# Patient Record
Sex: Male | Born: 1995 | Race: White | Hispanic: No | Marital: Single | Smoking: Never smoker
Health system: Southern US, Community
[De-identification: ages and names within clinical notes are randomized; demographics above are authoritative.]

## PROBLEM LIST (undated history)

## (undated) DIAGNOSIS — Z202 Contact with and (suspected) exposure to infections with a predominantly sexual mode of transmission: Secondary | ICD-10-CM

## (undated) DIAGNOSIS — J029 Acute pharyngitis, unspecified: Secondary | ICD-10-CM

## (undated) DIAGNOSIS — S43432D Superior glenoid labrum lesion of left shoulder, subsequent encounter: Secondary | ICD-10-CM

## (undated) DIAGNOSIS — A545 Gonococcal pharyngitis: Secondary | ICD-10-CM

## (undated) DIAGNOSIS — R1011 Right upper quadrant pain: Secondary | ICD-10-CM

## (undated) DIAGNOSIS — R1033 Periumbilical pain: Secondary | ICD-10-CM

## (undated) DIAGNOSIS — F909 Attention-deficit hyperactivity disorder, unspecified type: Secondary | ICD-10-CM

---

## 2015-11-10 ENCOUNTER — Emergency Department (HOSPITAL_COMMUNITY)
Admission: EM | Admit: 2015-11-10 | Discharge: 2015-11-10 | Disposition: A | Discharge status: Home / Self Care | Payer: BLUE CROSS/BLUE SHIELD | Attending: Emergency Medicine | Admitting: Emergency Medicine

## 2015-11-10 ENCOUNTER — Encounter (HOSPITAL_COMMUNITY): Payer: Self-pay

## 2015-11-10 DIAGNOSIS — F10129 Alcohol abuse with intoxication, unspecified: Secondary | ICD-10-CM | POA: Diagnosis present

## 2015-11-10 DIAGNOSIS — F10120 Alcohol abuse with intoxication, uncomplicated: Secondary | ICD-10-CM | POA: Insufficient documentation

## 2015-11-10 DIAGNOSIS — F10920 Alcohol use, unspecified with intoxication, uncomplicated: Secondary | ICD-10-CM

## 2015-11-10 HISTORY — DX: Attention-deficit hyperactivity disorder, unspecified type: F90.9

## 2015-11-10 MED ORDER — ONDANSETRON 8 MG PO TBDP
8.0000 mg | ORAL_TABLET | Freq: Once | ORAL | Status: AC
  Administered 2015-11-10: 8 mg via ORAL
  Filled 2015-11-10: qty 1

## 2015-11-10 MED ORDER — ONDANSETRON 8 MG PO TBDP: ORAL_TABLET | ORAL | Status: DC

## 2015-11-10 NOTE — ED Notes (Signed)
Pt was able to drink all the fluids without any vomiting or nausea.

## 2015-11-10 NOTE — ED Notes (Signed)
Pt. from [employer] told friend to call EMS. Last seen sober 9pm. Drank 9 shots, 1 beer. cbg of 90

## 2015-11-10 NOTE — Discharge Instructions (Signed)
Alcohol Intoxication  Alcohol intoxication occurs when you drink enough alcohol that it affects your ability to function. It can be mild or very severe. Drinking a lot of alcohol in a short time is called binge drinking. This can be very harmful. Drinking alcohol can also be more dangerous if you are taking medicines or other drugs. Some of the effects caused by alcohol may include:  · Loss of coordination.  · Changes in mood and behavior.  · Unclear thinking.  · Trouble talking (slurred speech).  · Throwing up (vomiting).  · Confusion.  · Slowed breathing.  · Twitching and shaking (seizures).  · Loss of consciousness.  HOME CARE  · Do not drive after drinking alcohol.  · Drink enough water and fluids to keep your pee (urine) clear or pale yellow. Avoid caffeine.  · Only take medicine as told by your doctor.  GET HELP IF:  · You throw up (vomit) many times.  · You do not feel better after a few days.  · You frequently have alcohol intoxication. Your doctor can help decide if you should see a substance use treatment counselor.  GET HELP RIGHT AWAY IF:  · You become shaky when you stop drinking.  · You have twitching and shaking.  · You throw up blood. It may look bright red or like coffee grounds.  · You notice blood in your poop (bowel movements).  · You become lightheaded or pass out (faint).  MAKE SURE YOU:   · Understand these instructions.  · Will watch your condition.  · Will get help right away if you are not doing well or get worse.     This information is not intended to replace advice given to you by your health care provider. Make sure you discuss any questions you have with your health care provider.     Document Released: 03/10/2008 Document Revised: 05/25/2013 Document Reviewed: 02/25/2013  Elsevier Interactive Patient Education ©2016 Elsevier Inc.

## 2015-11-10 NOTE — ED Notes (Signed)
Bed: WA18 Expected date:  Expected time:  Means of arrival:  Comments: EMS 

## 2015-11-10 NOTE — ED Provider Notes (Signed)
CSN: [id number]     Arrival date & time 11/10/15  0045 History  By signing my name below, I, Kelly Morris, attest that this documentation has been prepared under the direction and in the presence of Kelly Heitman, MD. Electronically Signed: Octavia Morris, ED Scribe. 11/10/2015. 12:53 AM.    No chief complaint on file.     Patient is a 20 y.o. male presenting with intoxication. The history is provided by the patient. No language interpreter was used.  Alcohol Intoxication This is a new problem. The current episode started 1 to 2 hours ago. The problem occurs constantly. The problem has not changed since onset.Pertinent negatives include no chest pain, no abdominal pain, no headaches and no shortness of breath. Nothing aggravates the symptoms. Nothing relieves the symptoms. He has tried nothing for the symptoms. The treatment provided no relief.   HPI Comments: Kelly Morris is a 20 y.o. male who presents to the Emergency Department complaining of constant, unchanging alcohol intoxication with one episode of vomiting onset tonight. EMS reports he drank 9 shots and one beet. According to [facility], pt was last seen sober at 9pm. He has not taken his ADD medication today.  No past medical history on file. No past surgical history on file. No family history on file. Social History  Substance Use Topics  . Smoking status: Not on file  . Smokeless tobacco: Not on file  . Alcohol Use: Not on file    Review of Systems  Respiratory: Negative for shortness of breath.   Cardiovascular: Negative for chest pain.  Gastrointestinal: Positive for vomiting. Negative for abdominal pain.  Neurological: Negative for headaches.  All other systems reviewed and are negative.     Allergies  Review of patient's allergies indicates not on file.  Home Medications   Prior to Admission medications   Not on File   Triage vitals: BP 131/88 mmHg  Pulse 97  Temp(Src) 97.9 F (36.6 C) (Oral)  Resp 16  SpO2  100% Physical Exam  Constitutional: He is oriented to person, place, and time. He appears well-developed and well-nourished. No distress.  HENT:  Head: Normocephalic and atraumatic.  Mouth/Throat: Oropharynx is clear and moist.  Moist mucus membranes, no exudates  Eyes: Conjunctivae are normal. Pupils are equal, round, and reactive to light.  Neck: Normal range of motion. Neck supple.  No Bruit's  Cardiovascular: Normal rate, regular rhythm and intact distal pulses.   Pulmonary/Chest: Effort normal and breath sounds normal. No stridor. He has no wheezes. He has no rales.  Lungs clear  Abdominal: Soft. Bowel sounds are normal. There is no tenderness. There is no rebound and no guarding.  Non hyperactive  Musculoskeletal: He exhibits no edema.  Neurological: He is alert and oriented to person, place, and time.  Skin: Skin is warm and dry.  Psychiatric: He has a normal mood and affect.  Nursing note and vitals reviewed.   ED Course  Procedures  DIAGNOSTIC STUDIES: Oxygen Saturation is 100% on RA, normal by my interpretation.  COORDINATION OF CARE:  12:53 AM Discussed treatment plan with pt at bedside and pt agreed to plan.  Labs Review Labs Reviewed - No data to display  Imaging Review No results found. I have personally reviewed and evaluated these images and lab results as part of my medical decision-making.   EKG Interpretation None      MDM   Final diagnoses:  None    Po challenged awake and alert ambulate about the department, stable for  discharge at this time  I personally performed the services described in this documentation, which was scribed in my presence. The recorded information has been reviewed and is accurate.     Cy Blamer, MD 11/10/15 [phone]

## 2015-11-10 NOTE — ED Notes (Signed)
Writer provided pt with a cup of sprite

## 2016-11-17 ENCOUNTER — Emergency Department (HOSPITAL_COMMUNITY): Payer: BLUE CROSS/BLUE SHIELD

## 2016-11-17 ENCOUNTER — Encounter (HOSPITAL_COMMUNITY): Payer: Self-pay

## 2016-11-17 DIAGNOSIS — R197 Diarrhea, unspecified: Secondary | ICD-10-CM | POA: Diagnosis not present

## 2016-11-17 DIAGNOSIS — R42 Dizziness and giddiness: Secondary | ICD-10-CM | POA: Insufficient documentation

## 2016-11-17 DIAGNOSIS — F909 Attention-deficit hyperactivity disorder, unspecified type: Secondary | ICD-10-CM | POA: Insufficient documentation

## 2016-11-17 DIAGNOSIS — R112 Nausea with vomiting, unspecified: Secondary | ICD-10-CM | POA: Diagnosis not present

## 2016-11-17 DIAGNOSIS — R0789 Other chest pain: Secondary | ICD-10-CM | POA: Insufficient documentation

## 2016-11-17 LAB — BASIC METABOLIC PANEL
Anion gap: 10 (ref 5–15)
BUN: 10 mg/dL (ref 6–20)
CHLORIDE: 103 mmol/L (ref 101–111)
CO2: 26 mmol/L (ref 22–32)
Calcium: 9.6 mg/dL (ref 8.9–10.3)
Creatinine, Ser: 0.89 mg/dL (ref 0.61–1.24)
GFR calc non Af Amer: >60 mL/min (ref >60)
Glucose, Bld: 90 mg/dL (ref 65–99)
Potassium: 3.6 mmol/L (ref 3.5–5.1)
Sodium: 139 mmol/L (ref 135–145)

## 2016-11-17 LAB — CBC
HCT: 42.2 % (ref 39.0–52.0)
HEMOGLOBIN: 14.7 g/dL (ref 13.0–17.0)
MCH: 31.4 pg (ref 26.0–34.0)
MCHC: 34.8 g/dL (ref 30.0–36.0)
MCV: 90.2 fL (ref 78.0–100.0)
Platelets: 284 10*3/uL (ref 150–400)
RBC: 4.68 MIL/uL (ref 4.22–5.81)
RDW: 12.9 % (ref 11.5–15.5)
WBC: 10.3 10*3/uL (ref 4.0–10.5)

## 2016-11-17 LAB — I-STAT TROPONIN, ED: TROPONIN I, POC: 0 ng/mL (ref 0.00–0.08)

## 2016-11-17 NOTE — ED Notes (Signed)
Patient refused ibuprofen.  Reassured patient.  Patient very anxious, shaky, and concerned.

## 2016-11-17 NOTE — ED Triage Notes (Signed)
Pt states that for the past three days he has not ate due to depression, pt now c/o of n/v, denies SI/HI/AVH, pt states that he is also having chest pressure and SOB with dizziness that has been going on for the past four weeks.

## 2016-11-17 NOTE — ED Notes (Signed)
Patient states his chest pain is worsening.  Will reassure patient, reassess, and offer ibuprofen.

## 2016-11-18 ENCOUNTER — Emergency Department (HOSPITAL_COMMUNITY)
Admission: EM | Admit: 2016-11-18 | Discharge: 2016-11-18 | Disposition: A | Discharge status: Home / Self Care | Payer: BLUE CROSS/BLUE SHIELD | Attending: Emergency Medicine | Admitting: Emergency Medicine

## 2016-11-18 DIAGNOSIS — R0789 Other chest pain: Secondary | ICD-10-CM | POA: Diagnosis not present

## 2016-11-18 DIAGNOSIS — R079 Chest pain, unspecified: Secondary | ICD-10-CM

## 2016-11-18 DIAGNOSIS — R197 Diarrhea, unspecified: Secondary | ICD-10-CM

## 2016-11-18 DIAGNOSIS — R112 Nausea with vomiting, unspecified: Secondary | ICD-10-CM

## 2016-11-18 DIAGNOSIS — R42 Dizziness and giddiness: Secondary | ICD-10-CM

## 2016-11-18 LAB — URINALYSIS, ROUTINE W REFLEX MICROSCOPIC
Bilirubin Urine: NEGATIVE
GLUCOSE, UA: NEGATIVE mg/dL
HGB URINE DIPSTICK: NEGATIVE
Ketones, ur: 5 mg/dL — AB
LEUKOCYTES UA: NEGATIVE
Nitrite: NEGATIVE
PROTEIN: NEGATIVE mg/dL
Specific Gravity, Urine: 1.025 (ref 1.005–1.030)
pH: 5 (ref 5.0–8.0)

## 2016-11-18 LAB — LIPASE, BLOOD: Lipase: 14 U/L (ref 11–51)

## 2016-11-18 LAB — HEPATIC FUNCTION PANEL
ALBUMIN: 4.8 g/dL (ref 3.5–5.0)
ALK PHOS: 49 U/L (ref 38–126)
ALT: 12 U/L — ABNORMAL LOW (ref 17–63)
AST: 16 U/L (ref 15–41)
BILIRUBIN TOTAL: 0.9 mg/dL (ref 0.3–1.2)
Bilirubin, Direct: <0.1 mg/dL — ABNORMAL LOW (ref 0.1–0.5)
TOTAL PROTEIN: 7 g/dL (ref 6.5–8.1)

## 2016-11-18 LAB — D-DIMER, QUANTITATIVE (NOT AT ARMC): D DIMER QUANT: 0.29 ug{FEU}/mL (ref 0.00–0.50)

## 2016-11-18 MED ORDER — ONDANSETRON HCL 4 MG/2ML IJ SOLN
4.0000 mg | Freq: Once | INTRAMUSCULAR | Status: AC
  Administered 2016-11-18: 4 mg via INTRAVENOUS
  Filled 2016-11-18: qty 2

## 2016-11-18 MED ORDER — KETOROLAC TROMETHAMINE 30 MG/ML IJ SOLN
30.0000 mg | Freq: Once | INTRAMUSCULAR | Status: AC
  Administered 2016-11-18: 30 mg via INTRAVENOUS
  Filled 2016-11-18: qty 1

## 2016-11-18 MED ORDER — SODIUM CHLORIDE 0.9 % IV BOLUS (SEPSIS)
1000.0000 mL | Freq: Once | INTRAVENOUS | Status: AC
  Administered 2016-11-18: 1000 mL via INTRAVENOUS

## 2016-11-18 MED ORDER — ONDANSETRON 4 MG PO TBDP: 4.0000 mg | ORAL_TABLET | Freq: Three times a day (TID) | ORAL | 0 refills | Status: AC | PRN

## 2016-11-18 NOTE — ED Provider Notes (Signed)
By signing my name below, I, Avnee Patel, attest that this documentation has been prepared under the direction and in the presence of Layla MawKristen N Nioka Thorington, DO  Electronically Signed: Clovis PuAvnee Patel, ED Scribe. 11/18/16. 1:54 AM.  TIME SEEN: 1:45 AM  CHIEF COMPLAINT:  Chief Complaint  Patient presents with  . Emesis  . Chest Pain    HPI:   Kelly Morris is a 21 y.o. male who presents to the Emergency Department With multiple different complaints. Patient is complaining of acute onset lower left abdominal pain onset 3 days. Pt also reports dizziness that he describes as the room spinning, nausea, 2 episodes of vomiting, decrease of appetite x 5 days, heat flashes, chills, resolved cough, headache, diarrhea.  He has also had constant SOB and chest pressure x 4 weeks. His chest pressure is worse with stress and sprinting. His abdominal pain is worse upon palpation. No alleviating factors noted. Pt denies dysuria, pain with urination, penile discharge, testicular pain, hx of abdominal surgeries. Pt reports he had a CT-abdomen and colonoscopy in 05/2016 which showed "minor swelling in intestines". No known drug allergies. No other complaints noted. No known sick contacts or recent travel. Denies headache, numbness, tingling or focal weakness.  ROS: See HPI Constitutional: Subjective fever  Eyes: no drainage  ENT: no runny nose   Cardiovascular:  +chest pain  Resp: +  SOB  GI:  vomiting GU: no dysuria Integumentary: no rash  Allergy: no hives  Musculoskeletal: no leg swelling  Neurological: no slurred speech ROS otherwise negative  PAST MEDICAL HISTORY/PAST SURGICAL HISTORY:  Past Medical History:  Diagnosis Date  . ADHD (attention deficit hyperactivity disorder)     MEDICATIONS:  Prior to Admission medications   Medication Sig Start Date End Date Taking? Authorizing Provider  ondansetron (ZOFRAN ODT) 8 MG disintegrating tablet 8mg  ODT q8 hours prn nausea 11/10/15   April Palumbo, MD     ALLERGIES:  No Known Allergies  SOCIAL HISTORY:  Social History  Substance Use Topics  . Smoking status: Never Smoker  . Smokeless tobacco: Never Used  . Alcohol use No    FAMILY HISTORY: No family history on file.  EXAM: BP 126/66   Pulse 88   Temp 99.2 F (37.3 C) (Oral)   Resp 18   SpO2 100%  CONSTITUTIONAL: Alert and oriented and responds appropriately to questions. Well-appearing; well-nourished, afebrile and nontoxic HEAD: Normocephalic EYES: Conjunctivae clear, PERRL, EOMI ENT: normal nose; no rhinorrhea; moist mucous membranes NECK: Supple, no meningismus, no nuchal rigidity, no LAD  CARD: RRR; S1 and S2 appreciated; no murmurs, no clicks, no rubs, no gallops RESP: Normal chest excursion without splinting or tachypnea; breath sounds clear and equal bilaterally; no wheezes, no rhonchi, no rales, no hypoxia or respiratory distress, speaking full sentences ABD/GI: Normal bowel sounds; non-distended; soft, non-tender, no rebound, no guarding, no peritoneal signs, no hepatosplenomegaly BACK:  The back appears normal and is non-tender to palpation, there is no CVA tenderness EXT: Normal ROM in all joints; non-tender to palpation; no edema; normal capillary refill; no cyanosis, no calf tenderness or swelling    SKIN: Normal color for age and race; warm; no rash NEURO: Moves all extremities equally, sensation to light touch intact diffusely, cranial nerves II through XII intact, normal speech PSYCH: The patient's mood and manner are appropriate. Grooming and personal hygiene are appropriate.  MEDICAL DECISION MAKING: Patient here with multiple different complaints. He is exam is unremarkable. He is hemodynamically stable. Discussed with patient that this could  be viral illness causing his symptoms. I do not feel he needs emergent imaging of his abdomen. Doubt colitis, bowel obstruction, diverticulitis, appendicitis given his benign exam. Will treat symptomatically with  Toradol, Zofran and IV fluids. Labs, urine pending.  ED PROGRESS: Patient's workup has been unremarkable. Normal labs including negative troponin, negative d-dimer. Normal LFTs and lipase. Urine shows no sign of infection or significant dehydration. Chest x-ray shows no acute abnormality. He has been able to eat, drink and ambulate without difficulty. I feel he is safe to be discharged home with outpatient follow-up with a primary care provider.   At this time, I do not feel there is any life-threatening condition present. I have reviewed and discussed all results (EKG, imaging, lab, urine as appropriate) and exam findings with patient/family. I have reviewed nursing notes and appropriate previous records.  I feel the patient is safe to be discharged home without further emergent workup and can continue workup as an outpatient as needed. Discussed usual and customary return precautions. Patient/family verbalize understanding and are comfortable with this plan.  Outpatient follow-up has been provided. All questions have been answered.    EKG Interpretation  Date/Time:  Monday November 17 2016 20:10:28 EST Ventricular Rate:  73 PR Interval:  128 QRS Duration: 96 QT Interval:  354 QTC Calculation: 389 R Axis:   94 Text Interpretation:  Normal sinus rhythm Rightward axis RSR' or QR pattern in V1 suggests right ventricular conduction delay Nonspecific T wave abnormality Abnormal ECG No old tracing to compare Confirmed by Lanesha Azzaro,  DO, Qualyn Oyervides ([id number]) on 11/18/2016 1:23:27 AM        I personally performed the services described in this documentation, which was scribed in my presence. The recorded information has been reviewed and is accurate.     Layla Maw Laurieann Friddle, DO 11/18/16 [phone]

## 2016-11-18 NOTE — ED Notes (Signed)
Pt ambulatory with no difficulty. TOlerating PO fluids

## 2016-11-18 NOTE — Discharge Instructions (Signed)
You may alternate between Tylenol 1000 mg every 6 hours as needed for fever and pain and ibuprofen 800 mg every 8 hours as needed for fever and pain. Please drink plenty of water and rest. Your workup today including lab work, chest x-ray, urine have been normal. I recommend follow-up with her primary care physician if symptoms continue.   To find a primary care or specialty doctor please call [phone] or [phone] to access "[place] Find a Doctor Service."  You may also go on the [facility] website at [email or web address]  There are also multiple [facility], [facility], [facility] and [facility] practices throughout the [place] that are frequently accepting new patients. You may find a clinic that is close to your home and contact them.  [facility] -  [address] [place] [id number] [phone]   [facility] -  [address] [place] [id number] [phone]   [facility] [phone]  [place] [id number] [phone]

## 2016-11-23 ENCOUNTER — Emergency Department (HOSPITAL_COMMUNITY): Payer: BLUE CROSS/BLUE SHIELD

## 2016-11-23 ENCOUNTER — Encounter (HOSPITAL_COMMUNITY): Payer: Self-pay | Admitting: Emergency Medicine

## 2016-11-23 ENCOUNTER — Emergency Department (HOSPITAL_COMMUNITY)
Admission: EM | Admit: 2016-11-23 | Discharge: 2016-11-23 | Disposition: A | Discharge status: Home / Self Care | Payer: BLUE CROSS/BLUE SHIELD | Attending: Emergency Medicine | Admitting: Emergency Medicine

## 2016-11-23 DIAGNOSIS — R1030 Lower abdominal pain, unspecified: Secondary | ICD-10-CM | POA: Diagnosis not present

## 2016-11-23 DIAGNOSIS — Z79899 Other long term (current) drug therapy: Secondary | ICD-10-CM | POA: Insufficient documentation

## 2016-11-23 DIAGNOSIS — J111 Influenza due to unidentified influenza virus with other respiratory manifestations: Secondary | ICD-10-CM | POA: Insufficient documentation

## 2016-11-23 DIAGNOSIS — Z5181 Encounter for therapeutic drug level monitoring: Secondary | ICD-10-CM | POA: Insufficient documentation

## 2016-11-23 DIAGNOSIS — R51 Headache: Secondary | ICD-10-CM | POA: Diagnosis present

## 2016-11-23 LAB — RAPID URINE DRUG SCREEN, HOSP PERFORMED
Amphetamines: NOT DETECTED
BARBITURATES: NOT DETECTED
BENZODIAZEPINES: NOT DETECTED
COCAINE: NOT DETECTED
Opiates: NOT DETECTED
Tetrahydrocannabinol: NOT DETECTED

## 2016-11-23 LAB — INFLUENZA PANEL BY PCR (TYPE A & B)
INFLBPCR: NEGATIVE
Influenza A By PCR: POSITIVE — AB

## 2016-11-23 LAB — URINALYSIS, ROUTINE W REFLEX MICROSCOPIC
BILIRUBIN URINE: NEGATIVE
Bacteria, UA: NONE SEEN
GLUCOSE, UA: NEGATIVE mg/dL
Ketones, ur: 5 mg/dL — AB
LEUKOCYTES UA: NEGATIVE
NITRITE: NEGATIVE
PH: 5 (ref 5.0–8.0)
Protein, ur: NEGATIVE mg/dL
SPECIFIC GRAVITY, URINE: 1.027 (ref 1.005–1.030)
SQUAMOUS EPITHELIAL / LPF: NONE SEEN

## 2016-11-23 LAB — CBC
HCT: 43.3 % (ref 39.0–52.0)
Hemoglobin: 15.2 g/dL (ref 13.0–17.0)
MCH: 31.7 pg (ref 26.0–34.0)
MCHC: 35.1 g/dL (ref 30.0–36.0)
MCV: 90.4 fL (ref 78.0–100.0)
PLATELETS: 265 10*3/uL (ref 150–400)
RBC: 4.79 MIL/uL (ref 4.22–5.81)
RDW: 13.1 % (ref 11.5–15.5)
WBC: 7.5 10*3/uL (ref 4.0–10.5)

## 2016-11-23 LAB — COMPREHENSIVE METABOLIC PANEL
ALT: 11 U/L — ABNORMAL LOW (ref 17–63)
AST: 15 U/L (ref 15–41)
Albumin: 4.9 g/dL (ref 3.5–5.0)
Alkaline Phosphatase: 47 U/L (ref 38–126)
Anion gap: 9 (ref 5–15)
BILIRUBIN TOTAL: 0.7 mg/dL (ref 0.3–1.2)
BUN: 9 mg/dL (ref 6–20)
CALCIUM: 9.5 mg/dL (ref 8.9–10.3)
CO2: 28 mmol/L (ref 22–32)
CREATININE: 1.04 mg/dL (ref 0.61–1.24)
Chloride: 102 mmol/L (ref 101–111)
Glucose, Bld: 87 mg/dL (ref 65–99)
Potassium: 3.7 mmol/L (ref 3.5–5.1)
Sodium: 139 mmol/L (ref 135–145)
TOTAL PROTEIN: 7.6 g/dL (ref 6.5–8.1)

## 2016-11-23 LAB — LIPASE, BLOOD: Lipase: 15 U/L (ref 11–51)

## 2016-11-23 LAB — I-STAT CG4 LACTIC ACID, ED: LACTIC ACID, VENOUS: 1.07 mmol/L (ref 0.5–1.9)

## 2016-11-23 MED ORDER — MORPHINE SULFATE (PF) 4 MG/ML IV SOLN
4.0000 mg | Freq: Once | INTRAVENOUS | Status: DC
  Filled 2016-11-23: qty 1

## 2016-11-23 MED ORDER — SODIUM CHLORIDE 0.9 % IV BOLUS (SEPSIS)
2000.0000 mL | Freq: Once | INTRAVENOUS | Status: AC
Start: 2016-11-23 — End: 2016-11-23
  Administered 2016-11-23: 2000 mL via INTRAVENOUS

## 2016-11-23 MED ORDER — IOPAMIDOL (ISOVUE-300) INJECTION 61%
INTRAVENOUS | Status: AC
  Administered 2016-11-23: 100 mL
  Filled 2016-11-23: qty 100

## 2016-11-23 MED ORDER — DICYCLOMINE HCL 10 MG PO CAPS
10.0000 mg | ORAL_CAPSULE | Freq: Once | ORAL | Status: AC
  Administered 2016-11-23: 10 mg via ORAL
  Filled 2016-11-23: qty 1

## 2016-11-23 MED ORDER — OSELTAMIVIR PHOSPHATE 75 MG PO CAPS
75.0000 mg | ORAL_CAPSULE | Freq: Once | ORAL | Status: AC
  Administered 2016-11-23: 75 mg via ORAL
  Filled 2016-11-23: qty 1

## 2016-11-23 MED ORDER — OSELTAMIVIR PHOSPHATE 75 MG PO CAPS: 75.0000 mg | ORAL_CAPSULE | Freq: Two times a day (BID) | ORAL | 0 refills | Status: AC

## 2016-11-23 MED ORDER — PROMETHAZINE HCL 25 MG PO TABS: 25.0000 mg | ORAL_TABLET | Freq: Four times a day (QID) | ORAL | 0 refills | Status: AC | PRN

## 2016-11-23 MED ORDER — SODIUM CHLORIDE 0.9 % IV BOLUS (SEPSIS)
2000.0000 mL | Freq: Once | INTRAVENOUS | Status: AC
  Administered 2016-11-23: 2000 mL via INTRAVENOUS

## 2016-11-23 MED ORDER — ONDANSETRON HCL 4 MG/2ML IJ SOLN
4.0000 mg | Freq: Once | INTRAMUSCULAR | Status: AC
  Administered 2016-11-23: 4 mg via INTRAVENOUS
  Filled 2016-11-23: qty 2

## 2016-11-23 NOTE — ED Triage Notes (Signed)
Pt. Stated, I ws at another UC last Sunday with a stomach issue and I thought I was getting better, but yesterday I started having a headache with Nausea.

## 2016-11-23 NOTE — ED Notes (Signed)
Patient transported to CT 

## 2016-11-23 NOTE — ED Provider Notes (Signed)
[facility]-EMERGENCY DEPT Provider Note   CSN: [id number] Arrival date & time: 11/23/16  1240     History   Chief Complaint Chief Complaint  Patient presents with  . Abdominal Pain  . Headache  . Nausea     HPI   Blood pressure 115/72, pulse 112, temperature 99.2 F (37.3 C), temperature source Oral, resp. rate 16, height 5\' 10"  (1.778 m), weight 61.2 kg, SpO2 98 %.  Kelly Morris is a 21 y.o. male complaining of Who is otherwise healthy complaining of right-sided abdominal pain worsening over the last 2 days with associated fever of 100.8 and emesis with headache and some rhinorrhea. No sick contacts. Patient had gastritis and was evaluated last week, he states he fully recovered from this and felt improved. He denies cough, sore throat, sick contacts, diarrhea, change in urination, testicular pain or swelling.  Past Medical History:  Diagnosis Date  . ADHD (attention deficit hyperactivity disorder)     There are no active problems to display for this patient.   History reviewed. No pertinent surgical history.     Home Medications    Prior to Admission medications   Medication Sig Start Date End Date Taking? Authorizing Provider  mirtazapine (REMERON) 7.5 MG tablet Take 7.5 mg by mouth at bedtime.    Historical Provider, MD  ondansetron (ZOFRAN ODT) 4 MG disintegrating tablet Take 1 tablet (4 mg total) by mouth every 8 (eight) hours as needed for nausea or vomiting. 11/18/16   Layla Maw Ward, DO  oseltamivir (TAMIFLU) 75 MG capsule Take 1 capsule (75 mg total) by mouth every 12 (twelve) hours. 11/23/16   Demetrica Zipp, PA-C  promethazine (PHENERGAN) 25 MG tablet Take 1 tablet (25 mg total) by mouth every 6 (six) hours as needed for nausea or vomiting. 11/23/16   Wynetta Emery, PA-C    Family History No family history on file.  Social History Social History  Substance Use Topics  . Smoking status: Never Smoker  . Smokeless tobacco: Never Used  . Alcohol use 3.6  oz/week    6 Cans of beer per week     Allergies   Patient has no known allergies.   Review of Systems Review of Systems  10 systems reviewed and found to be negative, except as noted in the HPI.  Physical Exam Updated Vital Signs BP 118/65   Pulse 95   Temp 99.2 F (37.3 C) (Oral)   Resp 16   Ht 5\' 10"  (1.778 m)   Wt 61.2 kg   SpO2 98%   BMI 19.37 kg/m   Physical Exam  Constitutional: He is oriented to person, place, and time. He appears well-developed and well-nourished. No distress.  HENT:  Head: Normocephalic and atraumatic.  Mouth/Throat: Oropharynx is clear and moist.  Eyes: Conjunctivae and EOM are normal. Pupils are equal, round, and reactive to light.  Neck: Normal range of motion.  FROM to C-spine. Pt can touch chin to chest without discomfort. No TTP of midline cervical spine.   Cardiovascular: Regular rhythm and intact distal pulses.   Tachycardic, regular  Pulmonary/Chest: Effort normal and breath sounds normal.  Abdominal: Soft. There is tenderness.  Mild tenderness to palpation in the right lower and left lower quadrant, no focal tenderness palpation over McBurney's point, Rovsing, psoas and obturator are negative.  Musculoskeletal: Normal range of motion.  Neurological: He is alert and oriented to person, place, and time.  Skin: Capillary refill takes less than 2 seconds. He is not diaphoretic.  Psychiatric:  He has a normal mood and affect.  Nursing note and vitals reviewed.    ED Treatments / Results  Labs (all labs ordered are listed, but only abnormal results are displayed) Labs Reviewed  COMPREHENSIVE METABOLIC PANEL - Abnormal; Notable for the following:       Result Value   ALT 11 (*)    All other components within normal limits  URINALYSIS, ROUTINE W REFLEX MICROSCOPIC - Abnormal; Notable for the following:    Hgb urine dipstick SMALL (*)    Ketones, ur 5 (*)    All other components within normal limits  INFLUENZA PANEL BY PCR (TYPE  A & B) - Abnormal; Notable for the following:    Influenza A By PCR POSITIVE (*)    All other components within normal limits  CULTURE, BLOOD (ROUTINE X 2)  CULTURE, BLOOD (ROUTINE X 2)  LIPASE, BLOOD  CBC  RAPID URINE DRUG SCREEN, HOSP PERFORMED  I-STAT CG4 LACTIC ACID, ED    EKG  EKG Interpretation None       Radiology Ct Abdomen Pelvis W Contrast  Result Date: 11/23/2016 CLINICAL DATA:  21 year old with bilateral low abdominal pain for 1 week. Worsening pain with nausea and dry heaves over the last day. EXAM: CT ABDOMEN AND PELVIS WITH CONTRAST TECHNIQUE: Multidetector CT imaging of the abdomen and pelvis was performed using the standard protocol following bolus administration of intravenous contrast. CONTRAST:  100mL ISOVUE-300 IOPAMIDOL (ISOVUE-300) INJECTION 61% COMPARISON:  None. FINDINGS: Lower chest: Clear lung bases. No significant pleural or pericardial effusion. Hepatobiliary: The liver appears normal. No evidence of gallstones, gallbladder wall thickening or biliary dilatation. Pancreas: Unremarkable. No pancreatic ductal dilatation or surrounding inflammatory changes. Spleen: Normal in size without focal abnormality. Adrenals/Urinary Tract: Both adrenal glands appear normal. Both kidneys appear normal. There is no evidence of renal mass, urinary tract calculus or hydronephrosis. The bladder appears normal for its degree of distention. Stomach/Bowel: No evidence of bowel wall thickening, distention or surrounding inflammatory change. The appendix appears normal. No enteric contrast was administered. Vascular/Lymphatic: There are no enlarged abdominal or pelvic lymph nodes. No significant vascular findings are present. Reproductive: The prostate gland and seminal vesicles appear normal. Other: No evidence of abdominal wall mass or hernia. No ascites. Musculoskeletal: No acute or significant osseous findings. IMPRESSION: Normal examination.  No explanation for the patient's symptoms.  Electronically Signed   By: Carey BullocksWilliam  Veazey M.D.   On: 11/23/2016 15:40    Procedures Procedures (including critical care time)  Medications Ordered in ED Medications  morphine 4 MG/ML injection 4 mg (4 mg Intravenous Refused 11/23/16 1445)  sodium chloride 0.9 % bolus 2,000 mL (0 mLs Intravenous Stopped 11/23/16 1620)  ondansetron (ZOFRAN) injection 4 mg (4 mg Intravenous Given 11/23/16 1442)  iopamidol (ISOVUE-300) 61 % injection (100 mLs  Contrast Given 11/23/16 1518)  sodium chloride 0.9 % bolus 2,000 mL (0 mLs Intravenous Stopped 11/23/16 1823)  dicyclomine (BENTYL) capsule 10 mg (10 mg Oral Given 11/23/16 1619)  oseltamivir (TAMIFLU) capsule 75 mg (75 mg Oral Given 11/23/16 1821)     Initial Impression / Assessment and Plan / ED Course  I have reviewed the triage vital signs and the nursing notes.  Pertinent labs & imaging results that were available during my care of the patient were reviewed by me and considered in my medical decision making (see chart for details).     Vitals:   11/23/16 1600 11/23/16 1630 11/23/16 1700 11/23/16 1823  BP: 117/70 112/56 120/65 118/65  Pulse: 99 99 96 95  Resp:      Temp:      TempSrc:      SpO2: 99% 99% 100% 98%  Weight:      Height:        Medications  morphine 4 MG/ML injection 4 mg (4 mg Intravenous Refused 11/23/16 1445)  sodium chloride 0.9 % bolus 2,000 mL (0 mLs Intravenous Stopped 11/23/16 1620)  ondansetron (ZOFRAN) injection 4 mg (4 mg Intravenous Given 11/23/16 1442)  iopamidol (ISOVUE-300) 61 % injection (100 mLs  Contrast Given 11/23/16 1518)  sodium chloride 0.9 % bolus 2,000 mL (0 mLs Intravenous Stopped 11/23/16 1823)  dicyclomine (BENTYL) capsule 10 mg (10 mg Oral Given 11/23/16 1619)  oseltamivir (TAMIFLU) capsule 75 mg (75 mg Oral Given 11/23/16 1821)    Marrion Accomando is 21 y.o. male presenting with Fever, right lower quadrant pain and emesis he is tachycardic, abdominal exam is nonfocal however he is quite diffusely tender  with normal bowel sounds. No true peritoneal signs, this is his second visit for emesis this week. Given his tachycardia and fever at home will obtain CT abdomen pelvis. He denies any GU symptoms denies testicular pain or swelling. Blood work reassuring, patient made nothing by mouth, given his rhinorrhea will also check for influenza.  Blood work reassuring with no significant abnormalities including leukocytosis, normal lactic acid. Urinalysis with 5 ketones and small hemoglobin consistent with mild dehydration. Given the severity of his pain and reported fever CAT scan ordered and normal. He remains persistently tachycardic will continue to bolus  Flu by PCR Positive for flu a. Discussed with patient and his mother via phone.Tachycardia improved, he is tolerating by mouth's, repeat abdominal exam benign.   Evaluation does not show pathology that would require ongoing emergent intervention or inpatient treatment. Pt is hemodynamically stable and mentating appropriately. Discussed findings and plan with patient/guardian, who agrees with care plan. All questions answered. Return precautions discussed and outpatient follow up given.     Final Clinical Impressions(s) / ED Diagnoses   Final diagnoses:  Influenza    New Prescriptions Discharge Medication List as of 11/23/2016  6:36 PM    START taking these medications   Details  oseltamivir (TAMIFLU) 75 MG capsule Take 1 capsule (75 mg total) by mouth every 12 (twelve) hours., Starting Sun 11/23/2016, Print    promethazine (PHENERGAN) 25 MG tablet Take 1 tablet (25 mg total) by mouth every 6 (six) hours as needed for nausea or vomiting., Starting Sun 11/23/2016, Print         [place], PA-C 11/23/16 1914    Doug Sou, MD 11/24/16 1600

## 2016-11-23 NOTE — Discharge Instructions (Signed)
Return to the emergency room for any worsening or concerning symptoms including fast breathing, heart racing, confusion, vomiting. ° °Rest, cover your mouth when you cough and wash your hands frequently.  ° °Push fluids: water or Gatorade, do not drink any soda, juice or caffeinated beverages. ° °For fever and pain control you can take Motrin (ibuprofen) as follows: 400 mg (this is normally 2 over the counter pills) °Three hours after you have had the Motrin take Tylenol (acetaminophen) as follows: You can take 650 mg (this is normally 2 over-the-counter pills) °Repeat the series by taking Motrin 3 hours later, continue to do this while you are awake. ° °Check to see that any other over-the-counter medications don't contain acetaminophen: you shouldn't have more than 3000 mg a day. ° °Do not return to work until 48 hours after your fever breaks.  ° °

## 2016-11-23 NOTE — ED Notes (Signed)
Signature pad broken, patient stable and ambulatory at discharge does not have any questions and getting picked up by friends

## 2016-11-28 LAB — CULTURE, BLOOD (ROUTINE X 2)
Culture: NO GROWTH
Culture: NO GROWTH

## 2020-03-12 LAB — HEPATITIS B SURFACE ANTIBODY
Hep B S Ab Interp: NON-IMMUNE/NOT IMMUNE
Hep B S Ab: 5.8 — ABNORMAL LOW (ref 12.0–999.0)

## 2020-03-26 LAB — CULTURE, THROAT: FINAL REPORT: NORMAL

## 2020-04-11 LAB — C. DIFFICILE TOXIN A/B
C Diff Toxin Interpretation: NEGATIVE
C. DIFFICILE TOXIN: NEGATIVE
C.diff Toxin/Antigen: NEGATIVE
CDiffGDH: NEGATIVE
Lot/Kit Number: 321059

## 2020-04-14 LAB — CULTURE, SALMONELLA/SHIGELLA/CAMPY, STOOL

## 2020-04-16 LAB — OVA AND PARASITE SCREEN

## 2020-04-18 LAB — MISCELLANEOUS REFERENCE TEST: test code: 188672

## 2020-04-19 LAB — POC COVID-19 (LIAT IN HOUSE): SARS-CoV-2: DETECTED — AB

## 2020-05-08 LAB — STREP A CULTURE, THROAT

## 2020-05-18 LAB — CULTURE, WOUND: FINAL REPORT: NORMAL

## 2020-07-16 LAB — HEPATITIS B SURFACE ANTIBODY
Hep B S Ab Interp: IMMUNE — AB
Hep B S Ab: >1000 — ABNORMAL HIGH (ref 12.0–999.0)

## 2021-05-15 ENCOUNTER — Encounter

## 2021-05-15 ENCOUNTER — Ambulatory Visit
Admit: 2021-05-15 | Discharge: 2021-05-15 | Payer: BLUE CROSS/BLUE SHIELD | Attending: Family Medicine | Primary: Family Medicine

## 2021-05-15 DIAGNOSIS — Z0000 Encounter for general adult medical examination without abnormal findings: Secondary | ICD-10-CM

## 2021-05-15 LAB — LIPASE: Lipase: 16 U/L (ref 13–60)

## 2021-05-15 LAB — CBC WITH AUTO DIFFERENTIAL
Absolute Baso #: 0 10*3/uL (ref 0.0–0.2)
Absolute Eos #: 0.3 10*3/uL (ref 0.0–0.5)
Absolute Lymph #: 2.1 10*3/uL (ref 1.0–3.2)
Absolute Mono #: 0.4 10*3/uL (ref 0.3–1.0)
Basophils %: 0.6 % (ref 0.0–2.0)
Eosinophils %: 5.7 % (ref 0.0–7.0)
Hematocrit: 44.1 % (ref 38.0–52.0)
Hemoglobin: 15.6 g/dL (ref 13.0–17.3)
Immature Grans (Abs): 0.02 10*3/uL (ref 0.00–0.06)
Immature Granulocytes: 0.4 % (ref 0.0–0.6)
Lymphocytes: 39.3 % (ref 15.0–45.0)
MCH: 31.3 pg (ref 27.0–34.5)
MCHC: 35.4 g/dL (ref 32.0–36.0)
MCV: 88.6 fL (ref 84.0–100.0)
MPV: 10.4 fL (ref 7.2–13.2)
Monocytes: 6.6 % (ref 4.0–12.0)
Neutrophils %: 47.4 % (ref 42.0–74.0)
Neutrophils Absolute: 2.6 10*3/uL (ref 1.6–7.3)
Platelets: 262 10*3/uL (ref 140–440)
RBC: 4.98 x10e6/mcL (ref 4.00–5.60)
RDW: 11.8 % (ref 11.0–16.0)
WBC: 5.4 10*3/uL (ref 3.8–10.6)

## 2021-05-15 LAB — LIPID PANEL
Chol/HDL Ratio: 2.6 (ref 0.0–4.4)
Cholesterol: 131 mg/dL (ref 100–200)
HDL: 50 mg/dL (ref >=40)
LDL Cholesterol: 69 mg/dL (ref 0.0–100.0)
LDL/HDL Ratio: 1
Triglycerides: 61 mg/dL (ref 0–149)
VLDL: 12.2 mg/dL (ref 5.0–40.0)

## 2021-05-15 LAB — COMPREHENSIVE METABOLIC PANEL
ALT: 13 U/L (ref 0–50)
AST: 14 U/L (ref 0–50)
Albumin/Globulin Ratio: 2 (ref 1.00–2.70)
Albumin: 5 g/dL (ref 3.5–5.2)
Alk Phosphatase: 51 U/L (ref 40–130)
Anion Gap: 10 mmol/L (ref 2–17)
BUN: 16 mg/dL (ref 6–20)
CO2: 25 mmol/L (ref 22–29)
Calcium: 9.6 mg/dL (ref 8.6–10.0)
Chloride: 107 mmol/L (ref 98–107)
Creatinine: 0.8 mg/dL (ref 0.7–1.3)
Est, Glom Filt Rate: 127 mL/min/1.73m?? (ref >=90)
Globulin: 2 g/dL (ref 1.9–4.4)
Glucose: 80 mg/dL (ref 70–99)
OSMOLALITY CALCULATED: 283 mOsm/kg (ref 270–287)
Potassium: 4.6 mmol/L (ref 3.5–5.3)
Sodium: 142 mmol/L (ref 135–145)
Total Bilirubin: 0.62 mg/dL (ref 0.00–1.20)
Total Protein: 7 g/dL (ref 6.4–8.3)

## 2021-05-15 NOTE — Progress Notes (Signed)
Darius Myers (DOB:  1996/04/20) is a 25 y.o. male here for evaluation of the following chief complaint(s):  Annual Exam      ASSESSMENT/PLAN:  Routine age appropriate counseling.    1. Routine general medical examination at a health care facility    No follow-ups on file.    SUBJECTIVE/OBJECTIVE:  HPI  Here for routine yearly physical, doing well without any complaints.  Coming up on his 25th birthday to get his Darius Myers evaluation.      Review of Systems  See HPI for details    Physical Exam  Physical Exam  BP 102/60    Pulse 76    Ht 5\' 10"  (1.778 m)    Wt 156 lb (70.8 kg)    SpO2 97%    BMI 22.38 kg/m??   General Appearance:  Well appearing, developed and nourished.  Appears stated age.  No acute distress.  Head:  Normocephalic, atraumatic.  Eyes:  Pupils equal, round and reactive to light.  Extraocular muscle movements intact  Ears:  External auditory canals clear, tympanic membranes intact.  Hearing grossly intact.  Nose:  Nares patent, no lesions.  Oral Cavity:  Mucosa moist, tongue in midline.  No oropharyngeal lesions.  Neck:  Supple, full range of motion.  No cervical lymphadenopathy.  No thyroid enlargement.  Heart:  Regular rate and regular rhythm.  No murmurs.  No rubs or gallops.  Lungs:  Clear to auscultation bilaterally with good air movement, no focal adventitial sounds.  Abdomen:  Non tender to palpation and non distended.  No masses or hernias. No organomegaly.  Skin:  Normal turgor.  No rashes noted.  Skin is warm and dry.  Musculoskeletal:  Full range of motion with no swelling or deformity.  Extremities:  No edema.  No clubbing or cyanosis.  Neurologic:  Motor strength normal in the upper and lower extremities.  Sensory exam intact.  Psych:  Cognitive function intact.  Judgment and insight normal.  Mood and affect appropriate.    An electronic signature was used to authenticate this note.    , MD

## 2021-05-26 ENCOUNTER — Ambulatory Visit
Admit: 2021-05-26 | Discharge: 2021-05-26 | Payer: BLUE CROSS/BLUE SHIELD | Attending: Emergency Medicine | Primary: Family Medicine

## 2021-05-26 DIAGNOSIS — J029 Acute pharyngitis, unspecified: Secondary | ICD-10-CM

## 2021-05-26 LAB — AMB POC RAPID STREP TEST: Group A Strep Antigen, POC: NEGATIVE

## 2021-05-26 MED ORDER — DOXYCYCLINE HYCLATE 100 MG PO TABS
100 MG | ORAL_TABLET | Freq: Two times a day (BID) | ORAL | 0 refills | Status: AC
Start: 2021-05-26 — End: 2021-06-05

## 2021-05-26 MED ORDER — DEXAMETHASONE SODIUM PHOSPHATE 4 MG/ML IJ SOLN
4 MG/ML | Freq: Once | INTRAMUSCULAR | Status: AC
Start: 2021-05-26 — End: 2021-05-26
  Administered 2021-05-26: 23:00:00 10 mg via ORAL

## 2021-05-26 NOTE — Progress Notes (Signed)
Chief Complaint    Darius Myers presents with   Chief Complaint   Patient presents with    Pharyngitis     Sharp pain with swallowing since Tuesday, negative covid test today at [facility].       Time Patient seen by Provider: 6:46 PM      HISTORY:    Source: patient,appears reliable    Add'l history from: Epic medical records        Symptoms: Sore throat times 5 days    Severity of symptoms: moderate  Onset : 5 days ago    Context: Prior treatments include negative covid test today at [facility].     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 Ten ROS reviewed and negative unless otherwise noted    PAST MEDICAL HISTORY:    Past Medical History: Updated reviewed and confirmed as below.    Past Medical History:   Diagnosis Date    ADHD     Environmental allergies     Severe- sees ENT       Past Surgical History: Updated reviewed and confirmed as below.    Past Surgical History:   Procedure Laterality Date    HERNIA REPAIR         Patient's Social History: Updated reviewed and confirmed as below.    Social History     Tobacco Use    Smoking status: Never    Smokeless tobacco: Never    Tobacco comments:     Patient counselled on the dangers of tobacco 05/11/2020, Cessation: Advice given   Vaping Use    Vaping Use: Never used   Substance Use Topics    Alcohol use: Never    Drug use: Never       Patient's Family History: Updated reviewed and confirmed as below.    Family History   Problem Relation Age of Onset    Other Mother         Muir-Torre Syndrome -- genetic predisposition for CRC    Hypertension Father     No Known Problems Sister     No Known Problems Brother     No Known Problems Maternal Grandmother     No Known Problems Maternal  Grandfather     No Known Problems Paternal Grandmother     No Known Problems Paternal Grandfather     No Known Problems Other        Patient's Allergies: Updated reviewed and confirmed as below.    Zofran [ondansetron]    Current Medications: Updated reviewed and confirmed as below.      Current Outpatient Medications:     doxycycline hyclate (VIBRA-TABS) 100 MG tablet, Take 1 tablet by mouth 2 times daily for 10 days, Disp: 20 tablet, Rfl: 0    Azelastine HCl 137 MCG/SPRAY SOLN, USE 1 SPRAY IN EACH NOSTRIL TWICE DAILY, Disp: , Rfl:     fluticasone (FLONASE) 50 MCG/ACT nasal spray, USE 1 SPRAY(S) IN EACH NOSTRIL TWICE DAILY, Disp: , Rfl:     levocetirizine (XYZAL) 5 MG tablet, TAKE 1 TABLET BY MOUTH ONCE DAILY IN THE EVENING FOR 30 DAYS,  Disp: , Rfl:     EPINEPHRINE, ANAPHYLAXIS, IJ, Inject as directed, Disp: , Rfl:     Nursing Notes and Vital Signs reviewed.    PHYSICAL EXAM:    BP 120/82    Pulse 95    Temp 98.7 ??F (37.1 ??C)    Resp 16    Ht 5\' 10"  (1.778 m)    Wt 161 lb 11.2 oz (73.3 kg)    SpO2 97%    BMI 23.20 kg/m??     GENERAL: alert, well appearing, well nourished, no acute distress    SKIN: normal , no rashes    HEAD: Normocephalic, no masses, lesions, or other abnormalities.    EYE EXAM: normal conjunctiva, discharge-none    EARS: External ears normal,    NOSE: no deformity. Sinus tenderness: none. Nasal mucosa turbinates without swelling.    OROPHARYNX: l  edema, erythema, and uvula midline     NECK: adenopathy: + adenopathy, supple, normal ROM, trachea midline, neck veins flat.    CHEST: normal chest symmetry and expansion, normal AP diameter. Lungs Clear to auscultation    HEART: Regular rate    LUNGS: Normal respiratory rate and normal respiratory effort.    EXTREMITIES no cyanosis or edema. No deformities.    NEURO EXAM: Normal sensorium,normal speech, moves all extremities equally, normal gait, no ataxia.    PSYCH: alert, with normal affect, appropriate speech.    UC COURSE:    Based on the history  and physical exam the following differential diagnosis was formulated: Pharnygitis rule out strep , mono, viral pharyngitis, URI, covid     The following diagnostic studies were performed and reviewed: strep screen Negative  Results for orders placed or performed in visit on 05/26/21   POC Strep A AG, EIA (Rapid Strep) 05/28/21)   Result Value Ref Range    Valid Internal Control, POC      Group A Strep Antigen, POC Negative Negative       Treatment in the Urgent Care department included: see orders     DIAGNOSIS AND PLAN:     Diagnosis Orders   1. Sore throat  POC Strep A AG, EIA (Rapid Strep) (93790)    dexamethasone (DECADRON) injection 10 mg    doxycycline hyclate (VIBRA-TABS) 100 MG tablet            acetaminophen, gargles, liquids, NSAID's, and throat sprays/lozenges    Plan: Self care per discharge instructions.         DISPOSITION:    Discharge to home with treatment as above. Patient to return or see primary care physician if symptoms worsen or fail to improve.    ([id number], DO

## 2021-08-10 ENCOUNTER — Ambulatory Visit
Admit: 2021-08-10 | Discharge: 2021-08-10 | Payer: BLUE CROSS/BLUE SHIELD | Attending: Emergency Medicine | Primary: Family Medicine

## 2021-08-10 DIAGNOSIS — H6503 Acute serous otitis media, bilateral: Secondary | ICD-10-CM

## 2021-08-10 MED ORDER — AMOXICILLIN-POT CLAVULANATE 875-125 MG PO TABS
875-125 MG | ORAL_TABLET | Freq: Two times a day (BID) | ORAL | 0 refills | Status: AC
Start: 2021-08-10 — End: 2021-08-20

## 2021-08-10 MED ORDER — PREDNISONE 20 MG PO TABS
20 MG | ORAL_TABLET | Freq: Every day | ORAL | 0 refills | Status: AC
Start: 2021-08-10 — End: 2021-08-15

## 2021-08-10 NOTE — Progress Notes (Signed)
Darius Myers    Time Patient seen by Provider: 1:03 PM      HISTORY:    Source: patient,appears reliable    Add'l history from: Epic medical records    Chief Complaint    Darius Myers presents today for   Chief Complaint   Patient presents with    Otalgia     Ear Pressure, facial pain x 6 days    .    Symptom onset has been worsening for a time period of 6 day(s). Severity is described as moderate to severe. Course of his symptoms over time is worsening.        Location: Sinuses    Quality:Pressure      Context: Prior treatments include otc allergy meds, flonase     Sick contacts: no    Associated Symptoms:    Admits to: ear pain    Denies: fever, chills    ROS:    See HPI for pertinent positives and negatives, and a total of ten systems were reviewed and were negative unless otherwise noted.    PAST MEDICAL HISTORY:    Patient's Past Medical History :Updated, reviewed and confirmed as below.    Past Medical History:   Diagnosis Date    ADHD     Environmental allergies     Severe- sees ENT       Past Surgical History: Updated, reviewed and confirmed as below.    Past Surgical History:   Procedure Laterality Date    HERNIA REPAIR         Patient's Social History: Updated, reviewed and confirmed as below.    Social History     Tobacco Use    Smoking status: Never    Smokeless tobacco: Never    Tobacco comments:     Patient counselled on the dangers of tobacco 05/11/2020, Cessation: Advice given   Vaping Use    Vaping Use: Never used   Substance Use Topics    Alcohol use: Never    Drug use: Never       Patient's Family History: reviewed and confirmed as below.    Family History   Problem Relation Age of Onset    Other Mother         Muir-Torre Syndrome -- genetic predisposition for CRC    Hypertension Father     No Known Problems Sister     No Known Problems Brother     No Known Problems Maternal Grandmother     No Known Problems Maternal Grandfather     No Known Problems Paternal Grandmother     No Known Problems Paternal  Grandfather     No Known Problems Other        Patient's Allergies: Updated, reviewed and confirmed as below.    Ondansetron    Current Medications:Updated, reviewed and confirmed as below.      Current Outpatient Medications:     amoxicillin-clavulanate (AUGMENTIN) 875-125 MG per tablet, Take 1 tablet by mouth 2 times daily for 10 days, Disp: 20 tablet, Rfl: 0    predniSONE (DELTASONE) 20 MG tablet, Take 3 tablets by mouth daily for 5 days, Disp: 15 tablet, Rfl: 0    Azelastine HCl 137 MCG/SPRAY SOLN, USE 1 SPRAY IN EACH NOSTRIL TWICE DAILY, Disp: , Rfl:     fluticasone (FLONASE) 50 MCG/ACT nasal spray, USE 1 SPRAY(S) IN EACH NOSTRIL TWICE DAILY, Disp: , Rfl:     levocetirizine (XYZAL) 5 MG tablet, TAKE 1 TABLET BY MOUTH ONCE  DAILY IN THE EVENING FOR 30 DAYS, Disp: , Rfl:     EPINEPHRINE, ANAPHYLAXIS, IJ, Inject as directed, Disp: , Rfl:     Nursing Notes and Vital Signs reviewed.    PHYSICAL EXAM:    BP 134/74 (Site: Left Upper Arm, Position: Sitting)    Pulse 85    Temp 98.1 ??F (36.7 ??C)    Resp 18    Ht 5\' 10"  (1.778 m)    Wt 158 lb 12.8 oz (72 kg)    SpO2 98%    BMI 22.79 kg/m??     GENERAL: alert, well appearing, well nourished, no acute distress    SKIN: normal , no rashes    HEAD: Normocephalic, no masses, lesions, or other abnormalities.    EYE EXAM: normal conjunctiva, discharge-none    EARS: External ears normal, canals clear, hearing grossly intact, TM's erythema and middle ear fluid     NOSE: no deformity. Nasal mucosa turbinates with swelling. Nasal discharge:clear    OROPHARYNX: lips and tongue normal. Throat without exudate and without erythema.    NECK: adenopathy: no significant adenopathy, supple, normal ROM, trachea midline, neck veins flat.    CHEST: normal chest symmetry and expansion, normal AP diameter.    HEART: Regular rate    LUNGS: Normal respiratory rate and normal respiratory effort. Clear to auscultation    EXTREMITIES no cyanosis or edema. No deformities.    PSYCH: alert, with normal  affect, appropriate speech.    UC COURSE:    Based on the history and physical exam the following differential diagnosis was formulated:sinusitis    The following diagnostic studies were performed and reviewed:none    Treatment in the Urgent Care department included: none     Diagnosis Orders   1. Bilateral acute serous otitis media, recurrence not specified  amoxicillin-clavulanate (AUGMENTIN) 875-125 MG per tablet    predniSONE (DELTASONE) 20 MG tablet              DISPOSITION:    Discharged-Self care per discharge sheet.    Patient instructed to contact Primary Care Provider if no improvement in one week or symptoms worsen. Contact Urgent Care if unable to reach Primary Care Provider.    , DO

## 2021-10-13 ENCOUNTER — Ambulatory Visit
Admit: 2021-10-13 | Discharge: 2021-10-13 | Payer: BLUE CROSS/BLUE SHIELD | Attending: Medical | Primary: Family Medicine

## 2021-10-13 DIAGNOSIS — R110 Nausea: Secondary | ICD-10-CM

## 2021-10-13 LAB — POC COVID-19 & INFLUENZA COMBO (LIAT IN HOUSE)
INFLUENZA A: NOT DETECTED
INFLUENZA B: NOT DETECTED
SARS-CoV-2: NOT DETECTED

## 2021-10-13 MED ORDER — PROMETHAZINE HCL 25 MG PO TABS
25 MG | ORAL_TABLET | Freq: Three times a day (TID) | ORAL | 0 refills | Status: AC | PRN
Start: 2021-10-13 — End: 2021-10-15

## 2021-10-13 NOTE — Progress Notes (Signed)
Darius Myers (DOB:  [birth year]) is a 26 y.o. male, here for evaluation of the following chief complaint(s):  Headache and Nausea    Review of Systems   All other systems reviewed and are negative.    History of Present Illness:   26yo male presents with acute onset HA, nausea. Symptoms started this morning. He denies fever/chills, cough, body aches. 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 He states that his partner has had vague viral symptoms the past several days as well. Pt denies abd pain, but he states that he has had more burping, more frequent stools without diarrhea.     Physical Exam  Vitals and nursing note reviewed.   Constitutional:       Appearance: Normal appearance.   HENT:      Head: Normocephalic and atraumatic.   Eyes:      Extraocular Movements: Extraocular movements intact.   Cardiovascular:      Rate and Rhythm: Normal rate and regular rhythm.   Pulmonary:      Effort: Pulmonary effort is normal.      Breath sounds: Normal breath sounds.   Abdominal:      General: Bowel sounds are normal. There is no distension.      Palpations: Abdomen is soft.      Tenderness: There is no abdominal tenderness.   Musculoskeletal:         General: Normal range of motion.      Cervical back: Normal range of motion.   Skin:     General: Skin is warm and dry.   Neurological:      General: No focal deficit present.      Mental Status: He is alert and oriented to person, place, and time.   Psychiatric:         Mood and Affect: Mood normal.         Behavior: Behavior normal.       ASSESSMENT/PLAN:  Pt with HA, nausea - covid/flu swab ordered. Test negative, pt informed. He is advised of likely viral gastroenteritis - phenergan sent to his pharmacy. Unable to give IM phenergan in clinic as he is driving. He is advised to adhere to clear liquids, avoid dairy. If tolerating oral fluids can advance to bland diet - BRAT diet (bananas, rice, applesauce, toast).  Follow-up with PCP or return here for persistent/worsening symptoms, ER precautions advised.    No follow-ups on file.     Allergies   Allergen Reactions    Ondansetron Other (See Comments)     Tongue numbness  Tongue numbness     Vitals:    10/13/21 1529   BP: 122/70   Pulse: 81   Resp: 18   Temp: 98 ??F (36.7 ??C)   SpO2: 98%      No results found for this visit on 10/13/21.  No results found for any visits on 10/13/21.      No problem-specific Assessment & Plan notes found for this encounter.    Electronically signed by:  --Louanna Raw, PA

## 2021-11-06 ENCOUNTER — Ambulatory Visit
Admit: 2021-11-07 | Discharge: 2021-11-07 | Payer: BLUE CROSS/BLUE SHIELD | Attending: Physician Assistant | Primary: Family Medicine

## 2021-11-06 NOTE — Progress Notes (Signed)
Dequavious Harshberger (DOB:  20-Nov-1995) is a 26 y.o. male, here for evaluation of the following chief complaint(s):  Sinus Problem, Congestion (Patient did 3 covid test they all were neg), Cough, and Shortness of Breath    Past Surgical History:   Procedure Laterality Date    HERNIA REPAIR        Current Outpatient Medications   Medication Sig Dispense Refill    amoxicillin (AMOXIL) 875 MG tablet Take 1 tablet by mouth 2 times daily for 10 days 20 tablet 0    cetirizine (ZYRTEC) 10 MG tablet Take 1 tablet by mouth daily for 14 days 14 tablet 0    benzonatate (TESSALON) 100 MG capsule Take 1 capsule by mouth in the morning and 1 capsule in the evening. Do all this for 7 days. 14 capsule 0    Azelastine HCl 137 MCG/SPRAY SOLN USE 1 SPRAY IN EACH NOSTRIL TWICE DAILY      fluticasone (FLONASE) 50 MCG/ACT nasal spray USE 1 SPRAY(S) IN EACH NOSTRIL TWICE DAILY      EPINEPHRINE, ANAPHYLAXIS, IJ Inject as directed      levocetirizine (XYZAL) 5 MG tablet TAKE 1 TABLET BY MOUTH ONCE DAILY IN THE EVENING FOR 30 DAYS (Patient not taking: Reported on 10/13/2021)       No current facility-administered medications for this visit.      Vitals:    11/06/21 1932   BP: 113/79   Site: Right Upper Arm   Position: Sitting   Cuff Size: Small Adult   Pulse: 100   Resp: 18   Temp: 97.8 ??F (36.6 ??C)   TempSrc: Oral   SpO2: 96%   Weight: 165 lb 12.8 oz (75.2 kg)   Height: 5\' 10"  (1.778 m)      Allergies   Allergen Reactions    Ondansetron Other (See Comments)     Tongue numbness  Tongue numbness     History of Present Illness:   26 year old male presents today with facial pressure and runny nose with congestion and ear pressure bilaterally. Patient denies fever or chills or nausea or vomiting or chest pain or shortness of breath or labored breathing/difficulty speaking or breathing or swallowing or tolerating p.o. intake.  Physical Exam  Vitals and nursing note reviewed.   Constitutional:       General: He is not in acute distress.     Appearance: Normal  appearance. He is not ill-appearing, toxic-appearing or diaphoretic.   HENT:      Head: Normocephalic and atraumatic.      Right Ear: Ear canal and external ear normal. No laceration or drainage. Tympanic membrane is erythematous. Tympanic membrane is not perforated.      Nose: Nose normal.      Mouth/Throat:      Mouth: Mucous membranes are moist.      Pharynx: Oropharynx is clear.   Eyes:      General: No scleral icterus.        Right eye: No discharge.         Left eye: No discharge.      Extraocular Movements: Extraocular movements intact.      Pupils: Pupils are equal, round, and reactive to light.   Cardiovascular:      Rate and Rhythm: Normal rate and regular rhythm.   Pulmonary:      Effort: Pulmonary effort is normal. No respiratory distress.      Breath sounds: Normal breath sounds. No wheezing.   Skin:  General: Skin is warm and dry.   Neurological:      General: No focal deficit present.      Mental Status: He is alert.     ASSESSMENT/PLAN:  No results found for any visits on 11/06/21.    Supportive measures discussed in detail with patient and medication risk versus benefit discussed with patient. I have extensively informed the patient of reasons to return to [facility] [place] and have informed them to return if they experience symptoms including but not limited to worsening of their symptoms. Patient expresses understanding and appreciation with today's management plan and expresses appreciation with the [facility] [place] staff after time was taken to discuss likely etiologies contributing to their symptoms and shared decision making occurred.  Although outpatient disposition decision was addressed at today's visit, I have informed patient not to ignore their symptoms, but to present to an emergency Department facility if symptoms worsen in any way.      Right acute otitis media  No follow-ups on file.  No results found for this  visit on 11/06/21.  Visit Diagnoses         Codes    Right acute otitis media    -  Primary H66.91            Diagnosis Orders   1. Right acute otitis media          No problem-specific Assessment & Plan notes found for this encounter.         Electronically signed by:  --Amanda Cockayne, PA-C

## 2021-11-07 MED ORDER — CETIRIZINE HCL 10 MG PO TABS
10 MG | ORAL_TABLET | Freq: Every day | ORAL | 0 refills | Status: AC
Start: 2021-11-07 — End: 2021-11-20

## 2021-11-07 MED ORDER — AMOXICILLIN 875 MG PO TABS
875 MG | ORAL_TABLET | Freq: Two times a day (BID) | ORAL | 0 refills | Status: AC
Start: 2021-11-07 — End: 2021-11-16

## 2021-11-07 MED ORDER — BENZONATATE 100 MG PO CAPS
100 MG | ORAL_CAPSULE | Freq: Two times a day (BID) | ORAL | 0 refills | Status: AC
Start: 2021-11-07 — End: 2021-11-13

## 2021-12-22 ENCOUNTER — Ambulatory Visit: Admit: 2021-12-22 | Discharge: 2021-12-22 | Payer: BLUE CROSS/BLUE SHIELD | Primary: Family Medicine

## 2021-12-22 ENCOUNTER — Ambulatory Visit
Admit: 2021-12-22 | Discharge: 2021-12-22 | Payer: PRIVATE HEALTH INSURANCE | Attending: Family Medicine | Primary: Family Medicine

## 2021-12-22 DIAGNOSIS — M25531 Pain in right wrist: Secondary | ICD-10-CM

## 2021-12-22 MED ORDER — IBUPROFEN 600 MG PO TABS
600 MG | ORAL_TABLET | Freq: Three times a day (TID) | ORAL | 0 refills | Status: DC | PRN
Start: 2021-12-22 — End: 2022-11-25

## 2021-12-22 NOTE — Progress Notes (Signed)
Darius Myers (DOB:  [birth year]) is a 26 y.o. 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 Reports he reached out and grabbed the steering wheel with his right hand to stop his fall.  He has noticed pain to the right elbow and wrist, especially on supination all day today. There is minimal swelling. ROM limited in supination by pain. Reported some numbness to the right hand today after he noticed a "pop" when supinating wrist.  This is a work injury     BP 125/85 (Site: Right Upper Arm, Position: Sitting, Cuff Size: Medium Adult)    Pulse 74    Temp 98.3 ??F (36.8 ??C) (Oral)    Resp 16    SpO2 96%      Allergies   Allergen Reactions    Ondansetron Other (See Comments)     Tongue numbness  Tongue numbness      Current Outpatient Medications   Medication Sig Dispense Refill    ibuprofen (ADVIL;MOTRIN) 600 MG tablet Take 1 tablet by mouth 3 times daily as needed for Pain 30 tablet 0    Azelastine HCl 137 MCG/SPRAY SOLN USE 1 SPRAY IN EACH NOSTRIL TWICE DAILY      fluticasone (FLONASE) 50 MCG/ACT nasal spray USE 1 SPRAY(S) IN EACH NOSTRIL TWICE DAILY      EPINEPHRINE, ANAPHYLAXIS, IJ Inject as directed       No current facility-administered medications for this visit.      Past Medical History:   Diagnosis Date    ADHD     Environmental allergies     Severe- sees ENT      Past Surgical History:   Procedure Laterality Date    HERNIA REPAIR        Family History   Problem Relation Age of Onset    Other Mother         Muir-Torre Syndrome -- genetic predisposition for CRC    Hypertension Father     No Known Problems Sister     No Known Problems Brother     No Known Problems Maternal Grandmother     No Known Problems Maternal Grandfather     No  Known Problems Paternal Grandmother     No Known Problems Paternal Grandfather     No Known Problems Other       Social History     Occupational History    Not on file   Tobacco Use    Smoking status: Never    Smokeless tobacco: Never    Tobacco comments:     Patient counselled on the dangers of tobacco 05/11/2020, Cessation: Advice given   Vaping Use    Vaping Use: Never used   Substance and Sexual Activity    Alcohol use: Never    Drug use: Never    Sexual activity: Yes     Partners: Male          Review of Systems   Constitutional:  Negative for activity change and fever.   HENT:  Negative for rhinorrhea.    Eyes: Negative.    Respiratory:  Negative for cough.    Cardiovascular: Negative.    Gastrointestinal: Negative.    Endocrine: Negative.  Genitourinary: Negative.    Musculoskeletal:  Positive for arthralgias and myalgias.   Skin: Negative.    Allergic/Immunologic: Negative.    Neurological:  Negative for weakness.   Hematological: Negative.    Psychiatric/Behavioral: Negative.          Objective   Physical Exam  Constitutional:       Appearance: Normal appearance.   HENT:      Head: Normocephalic and atraumatic.      Right Ear: External ear normal.      Left Ear: External ear normal.   Eyes:      Extraocular Movements: Extraocular movements intact.      Pupils: Pupils are equal, round, and reactive to light.   Cardiovascular:      Rate and Rhythm: Normal rate and regular rhythm.   Pulmonary:      Effort: Pulmonary effort is normal.      Breath sounds: Normal breath sounds. No wheezing or rhonchi.   Musculoskeletal:      Right elbow: Tenderness present.      Right wrist: Tenderness present. No lacerations, snuff box tenderness or crepitus. Decreased range of motion. Normal pulse.        Arms:       Cervical back: Normal range of motion.      Comments: TTP over the distal wrist ulnar aspect as well as the elbow, especially at the ulna. There is no swelling. ROM in pronation of wrist is limited by pain. 5/5  strength.   Neurological:      General: No focal deficit present.      Mental Status: He is alert.   Psychiatric:         Mood and Affect: Mood normal.         Behavior: Behavior normal.             ASSESSMENT/PLAN:  1. Right wrist pain  -     XR WRIST RIGHT (MIN 3 VIEWS); Future      X-ray shows no fracture or dislocation.    -     ibuprofen (ADVIL;MOTRIN) 600 MG tablet; Take 1 tablet by mouth 3 times daily as needed for Pain, Disp-30 tablet, R-0Normal    We will prescribe Ibuprofen and recommend icing in 30 minute intervals. Patient will return to full duty after one shift for rest. He should return to clinic if symptoms worsen. He expresses understanding ans agreement with plan of care.     2. Right elbow pain  -     XR ELBOW RIGHT (MIN 3 VIEWS); Future    X-ray shows no fracture or dislocation.    -     ibuprofen (ADVIL;MOTRIN) 600 MG tablet; Take 1 tablet by mouth 3 times daily as needed for Pain, Disp-30 tablet, R-0Normal    We will prescribe Ibuprofen and recommend icing in 30 minute intervals. Patient will return to full duty after one shift for rest. He should return to clinic if symptoms worsen. He expresses understanding ans agreement with plan of care.       Return if symptoms worsen or fail to improve.           An electronic signature was used to authenticate this note.    --Kenton Kingfisher, MD

## 2022-02-13 ENCOUNTER — Ambulatory Visit
Admit: 2022-02-13 | Discharge: 2022-02-13 | Payer: BLUE CROSS/BLUE SHIELD | Attending: Family Medicine | Primary: Family Medicine

## 2022-02-13 ENCOUNTER — Inpatient Hospital Stay: Admit: 2022-02-13 | Discharge: 2022-02-13 | Payer: BLUE CROSS/BLUE SHIELD | Primary: Family Medicine

## 2022-02-13 DIAGNOSIS — Z113 Encounter for screening for infections with a predominantly sexual mode of transmission: Secondary | ICD-10-CM

## 2022-02-13 DIAGNOSIS — J029 Acute pharyngitis, unspecified: Secondary | ICD-10-CM

## 2022-02-13 LAB — AMB POC RAPID STREP TEST: Group A Strep Antigen, POC: NEGATIVE

## 2022-02-13 LAB — POC HETEROPHILE ANTIBODY SCREEN: Mononucleosis Screen, POC: NEGATIVE

## 2022-02-14 ENCOUNTER — Encounter

## 2022-02-14 LAB — HIV SCREEN: HIV Screen: NEGATIVE

## 2022-02-17 LAB — MISCELLANEOUS SENDOUT: test code: 188698

## 2022-02-18 ENCOUNTER — Telehealth

## 2022-02-18 NOTE — Telephone Encounter (Signed)
Attempted to call pt to let him know we need him to come back in to repeat a test. Unable to LM due to phone never went to VM. Portal message sent

## 2022-02-19 ENCOUNTER — Encounter

## 2022-02-19 LAB — RPR: RPR: NONREACTIVE

## 2022-02-19 NOTE — Telephone Encounter (Signed)
Pt contacted and pt came by and was re swabbed.

## 2022-02-25 LAB — CT/NG BY NAA, PHARYNGEAL
Chlamydia Trachomatis, NAA, Nasopharynx: NEGATIVE
Neisseria Gonorrhoeae, NAA, Nasopharynx: NEGATIVE

## 2022-03-10 NOTE — Progress Notes (Signed)
Darius Myers (DOB:  05/06/1996) is a 26 y.o. male here for evaluation of the following chief complaint(s):  Other (Requesting Referral to GI ) and Blood Work (Possible blood work - Pt had oral intercourse and has developed spots and soreness in his throat  )    Assessment and Plan   1. Pharyngitis, unspecified etiology  Comments:  Reviewed differential, could be simply viral unrelated, but we'll screen other possibilities, aware of return precautions if does not resolve.  Orders:  -     HIV Screen; Future  -     RPR; Future  -     POC Heterophile Antibodies (63845)  -     POC Strep A AG, EIA (Rapid Strep) (36468)  2. Screening examination for sexually transmitted disease  -     HIV Screen; Future  -     RPR; Future  3. Lynch syndrome  Comments:  Diagnostic testing we'll ensure has GI and Heme/Onc to follow with long term ensuring appropriate screening intervals.  Orders:  -     [facility] - Harrold Donath, MD, Hematology & Oncology - [place]  -     Gay Filler, DO - Gastroenterology  4. MSH2 gene mutation  -     [facility] - Harrold Donath, MD, Hematology & Oncology - [place]  -     Gay Filler, DO - Gastroenterology  5. Monoallelic mutation of SMARCA4 gene  -     [facility] - Harrold Donath, MD, Hematology & Oncology - [place], DO - Gastroenterology    Return if symptoms worsen or fail to improve, for Pending specialist evaluation., Pending labs and/or testing.  Subjective   History  Patient reports he had oral intercourse with another male partner and since has noted a sore throat, and spots he thinks in his posterior oropharynx, curious what it might be.  No systemic fevers or chills, would be open to full STD testing.   Objective   Physical Exam  Constitutional:       General: He is not in acute distress.     Appearance: Normal appearance.   HENT:      Head: Normocephalic and atraumatic.      Mouth/Throat:      Pharynx: Posterior oropharyngeal erythema (palantine tonsils with scant  exudate) present.   Eyes:      Extraocular Movements: Extraocular movements intact.      Pupils: Pupils are equal, round, and reactive to light.   Cardiovascular:      Rate and Rhythm: Normal rate and regular rhythm.      Heart sounds: No murmur heard.    No friction rub. No gallop.   Pulmonary:      Effort: Pulmonary effort is normal.      Breath sounds: Normal breath sounds. No wheezing, rhonchi or rales.   Musculoskeletal:         General: No deformity. Normal range of motion.   Skin:     General: Skin is warm and dry.   Neurological:      General: No focal deficit present.      Mental Status: He is alert and oriented to person, place, and time.   Psychiatric:         Mood and Affect: Mood normal.         Behavior: Behavior normal.         Thought Content: Thought content normal.         Judgment: Judgment  normal.     Results for orders placed or performed in visit on 02/13/22   Miscellaneous Sendout   Result Value Ref Range    Test Name CGT throat     Reference Lab [facility]     test code [phone]     Test Results Seperate Report    Results for orders placed or performed during the hospital encounter of 02/13/22   HIV Screen   Result Value Ref Range    HIV Screen Negative Negative   RPR   Result Value Ref Range    RPR Non-Reactive Non-Reactive   Results for orders placed or performed in visit on 02/13/22   POC Heterophile Antibodies (20355)   Result Value Ref Range    VALID INTERNAL CONTROL      Mononucleosis Screen, POC Negative    POC Strep A AG, EIA (Rapid Strep) (97416)   Result Value Ref Range    Valid Internal Control, POC      Group A Strep Antigen, POC Negative Negative     An electronic signature was used to authenticate this note.  Adair Patter, MD

## 2022-04-10 ENCOUNTER — Ambulatory Visit: Admit: 2022-04-10 | Discharge: 2022-04-10 | Payer: BLUE CROSS/BLUE SHIELD | Attending: Family | Primary: Family Medicine

## 2022-04-10 DIAGNOSIS — J020 Streptococcal pharyngitis: Secondary | ICD-10-CM

## 2022-04-10 LAB — POC COVID-19 & INFLUENZA COMBO (LIAT IN HOUSE)
INFLUENZA A: NOT DETECTED
INFLUENZA B: NOT DETECTED
SARS-CoV-2: NOT DETECTED

## 2022-04-10 LAB — AMB POC RAPID STREP A: Group A Strep Antigen, POC: POSITIVE

## 2022-04-10 MED ORDER — PROMETHAZINE HCL 25 MG PO TABS
25 MG | ORAL_TABLET | Freq: Four times a day (QID) | ORAL | 0 refills | Status: AC | PRN
Start: 2022-04-10 — End: 2022-04-17

## 2022-04-10 MED ORDER — AMOXICILLIN-POT CLAVULANATE 875-125 MG PO TABS
875-125 MG | ORAL_TABLET | Freq: Two times a day (BID) | ORAL | 0 refills | Status: AC
Start: 2022-04-10 — End: 2022-04-20

## 2022-04-10 MED ORDER — PREDNISONE 20 MG PO TABS
20 MG | ORAL_TABLET | Freq: Every day | ORAL | 0 refills | Status: AC
Start: 2022-04-10 — End: 2022-04-15

## 2022-04-10 NOTE — Progress Notes (Signed)
Darius Myers (DOB:  [birth year]) is a 26 y.o. male,Established patient, here for evaluation of the following chief complaint(s):  Fever and Abdominal Pain (Since yesterday also having nausea and post nasal drip.)      History of Present Illness:     26 year old male presents with complaints of fever abdominal pain nausea postnasal drip starting yesterday.  Fever as high as 102.  Associated tachycardia.  Nausea without vomiting. 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  Associated left lower quadrant abdominal pain.  No diarrhea, no blood in stools.  Past Medical History:   Diagnosis Date    ADHD     Environmental allergies     Severe- sees ENT      Past Surgical History:   Procedure Laterality Date    HERNIA REPAIR        Current Outpatient Medications   Medication Sig Dispense Refill    Acetaminophen (TYLENOL 8 HOUR PO) Take by mouth      promethazine (PHENERGAN) 25 MG tablet Take 1 tablet by mouth every 6 hours as needed for Nausea 30 tablet 0    amoxicillin-clavulanate (AUGMENTIN) 875-125 MG per tablet Take 1 tablet by mouth 2 times daily for 10 days 20 tablet 0    predniSONE (DELTASONE) 20 MG tablet Take 2 tablets by mouth daily for 5 days 10 tablet 0    Azelastine HCl 137 MCG/SPRAY SOLN USE 1 SPRAY IN EACH NOSTRIL TWICE DAILY      fluticasone (FLONASE) 50 MCG/ACT nasal spray USE 1 SPRAY(S) IN EACH NOSTRIL TWICE DAILY      EPINEPHRINE, ANAPHYLAXIS, IJ Inject as directed      ibuprofen (ADVIL;MOTRIN) 600 MG tablet Take 1 tablet by mouth 3 times daily as needed for Pain (Patient not taking: Reported on 04/10/2022) 30 tablet 0     No current facility-administered medications for this visit.      Allergies   Allergen Reactions    Ondansetron Other (See Comments)     Tongue numbness  Tongue numbness       Vitals:    04/10/22 0902   BP: 112/72   Pulse: (!) 112   Resp: 18   Temp: 97.3 F (36.3 C)   TempSrc: Tympanic   SpO2: 97%   Weight: 162 lb (73.5 kg)   Height: 5\' 10"  (1.778 m)         Physical Exam:  Physical  Exam  Vitals and nursing note reviewed.   Constitutional:       Appearance: Normal appearance.   HENT:      Head: Normocephalic and atraumatic.      Right Ear: Tympanic membrane and ear canal normal.      Left Ear: Tympanic membrane and ear canal normal.      Nose: Congestion present.      Mouth/Throat:      Pharynx: Pharyngeal swelling, posterior oropharyngeal erythema and uvula swelling present.      Tonsils: 2+ on the right. 2+ on the left.   Cardiovascular:      Rate and Rhythm: Normal rate and regular rhythm.   Pulmonary:      Effort: Pulmonary effort is normal.      Breath sounds: Normal breath sounds.   Abdominal:      Tenderness: There is abdominal tenderness.   Skin:     General: Skin is warm and dry.   Neurological:      Mental Status: He is alert and oriented to person, place, and time. Mental  status is at baseline.            ASSESSMENT/PLAN:    ICD-10-CM    1. Strep pharyngitis  J02.0 amoxicillin-clavulanate (AUGMENTIN) 875-125 MG per tablet     predniSONE (DELTASONE) 20 MG tablet      2. Fever, unspecified fever cause  R50.9 POC COVID-19 & Influenza Combo ([facility])     POC COVID-19 & Influenza Combo ([facility])      3. Sore throat  J02.9 POC Strep A Assay w/Optic (10272)      4. Nausea  R11.0 promethazine (PHENERGAN) 25 MG tablet      5. LLQ abdominal pain  R10.32            1. Strep pharyngitis  -     amoxicillin-clavulanate (AUGMENTIN) 875-125 MG per tablet; Take 1 tablet by mouth 2 times daily for 10 days, Disp-20 tablet, R-0Normal  -     predniSONE (DELTASONE) 20 MG tablet; Take 2 tablets by mouth daily for 5 days, Disp-10 tablet, R-0Normal  2. Fever, unspecified fever cause  -     POC COVID-19 & Influenza Combo ([facility] in [facility]); Future  3. Sore throat  -     POC Strep A Assay w/Optic (53664)  4. Nausea  -     promethazine (PHENERGAN) 25 MG tablet; Take 1 tablet by mouth every 6 hours as needed for Nausea, Disp-30 tablet, R-0Normal  5. LLQ abdominal pain      Visit Diagnoses         Codes     Strep pharyngitis    -  Primary J02.0    Fever, unspecified fever cause     R50.9    Sore throat     J02.9    LLQ abdominal pain     R10.32             Results for orders placed or performed in visit on 04/10/22   POC COVID-19 & Influenza Combo ([facility] in [facility])   Result Value Ref Range    SARS-CoV-2 Not Detected Not Detected    INFLUENZA A Not Detected Not Detected    INFLUENZA B Not Detected Not Detected    Narrative    Is this test for diagnosis or screening?->Diagnosis of ill patient  Symptomatic for COVID-19 as defined by CDC?->Yes  Date of Symptom Onset->04/09/22  Hospitalized for COVID-19?->No  Admitted to ICU for COVID-19?->No  Employed in healthcare setting?->No  Resident in a congregate (group) care setting?->No  Previously tested for COVID-19?->No  Pregnant:->No   POC Strep A Assay w/Optic (40347)   Result Value Ref Range    Valid Internal Control, POC yes     Group A Strep Antigen, POC Positive Negative              Testing negative for COVID influenza, positive for strep throat.  Patient with allergy to Zofran.  We will treat strep pharyngitis with Augmentin prednisone, Phenergan for nausea.  Patient advised to return if symptoms worsen or remain unresolved.  Patient advised to continue monitoring left lower quadrant abdominal pain if it becomes more severe ER precautions as advised.      Return if symptoms worsen or fail to improve.      Electronically signed by:  --Fuller Song, APRN - NP

## 2022-06-03 ENCOUNTER — Ambulatory Visit
Admit: 2022-06-03 | Discharge: 2022-06-03 | Payer: BLUE CROSS/BLUE SHIELD | Attending: Physician Assistant | Primary: Family Medicine

## 2022-06-03 DIAGNOSIS — J069 Acute upper respiratory infection, unspecified: Secondary | ICD-10-CM

## 2022-06-03 MED ORDER — METHYLPREDNISOLONE 4 MG PO TBPK
4 MG | PACK | ORAL | 0 refills | Status: DC
Start: 2022-06-03 — End: 2022-11-25

## 2022-06-03 MED ORDER — AMOXICILLIN-POT CLAVULANATE 875-125 MG PO TABS
875-125 MG | ORAL_TABLET | Freq: Two times a day (BID) | ORAL | 0 refills | Status: AC
Start: 2022-06-03 — End: 2022-06-10

## 2022-06-03 NOTE — Progress Notes (Unsigned)
ASSESSMENT/PLAN:     {There are no diagnoses linked to this encounter. (Refresh or delete this SmartLink)}    No results found for any visits on 06/03/22.    There are no Patient Instructions on file for this visit.     No follow-ups on file.          CHIEF COMPLAINT:  Darius Myers (DOB:  12/10/1995) is a 26 y.o. male, here for evaluation of the following chief complaint(s):  Sinus Problem (Patient has been having sinus issues for 7 days , pressure in ears & teeth )      HPI:  HPI    CURRENT MEDICATIONS:  Outpatient Medications Marked as Taking for the 06/03/22 encounter (Office Visit) with Jason Nest, PA   Medication Sig Dispense Refill   . fluticasone (FLONASE) 50 MCG/ACT nasal spray USE 1 SPRAY(S) IN EACH NOSTRIL TWICE DAILY     . EPINEPHRINE, ANAPHYLAXIS, IJ Inject as directed         REVIEW OF SYSTEMS:  Review of Systems    ALLERGIES:  Allergies   Allergen Reactions   . Ondansetron Other (See Comments)     Tongue numbness  Tongue numbness        PAST MEDICAL HISTORY:  Past Medical History:   Diagnosis Date   . ADHD    . Environmental allergies     Severe- sees ENT       PAST SURGICAL HISTORY:  Past Surgical History:   Procedure Laterality Date   . HERNIA REPAIR         FAMILY HISTORY:  Family History   Problem Relation Age of Onset   . Other Mother         Muir-Torre Syndrome -- genetic predisposition for CRC   . Hypertension Father    . No Known Problems Sister    . No Known Problems Brother    . No Known Problems Maternal Grandmother    . No Known Problems Maternal Grandfather    . No Known Problems Paternal Grandmother    . No Known Problems Paternal Grandfather    . No Known Problems Other        TOBACCO HISTORY:  Social History     Tobacco Use   Smoking Status Never   Smokeless Tobacco Never   Tobacco Comments    Patient counselled on the dangers of tobacco 05/11/2020, Cessation: Advice given       SOCIAL HISTORY:  Social History     Socioeconomic History   . Marital status: Single     Spouse name: None    . Number of children: None   . Years of education: None   . Highest education level: None   Tobacco Use   . Smoking status: Never   . Smokeless tobacco: Never   . Tobacco comments:     Patient counselled on the dangers of tobacco 05/11/2020, Cessation: Advice given   Vaping Use   . Vaping Use: Never used   Substance and Sexual Activity   . Alcohol use: Never   . Drug use: Never   . Sexual activity: Yes     Partners: Male       OB HISTORY:  OB History   No obstetric history on file.       VITAL SIGNS:  Vitals:    06/03/22 1904   BP: 120/76   Pulse: 86   Resp: 18   Temp: 98.6 F (37 C)   SpO2: 99%  Weight: 164 lb (74.4 kg)   Height: 5\' 10"  (1.778 m)       PHYSICAL EXAM:  Physical Exam           An electronic signature was used to authenticate this note.    --Jason Nest, PA

## 2022-11-25 ENCOUNTER — Ambulatory Visit
Admit: 2022-11-25 | Discharge: 2022-11-25 | Payer: BLUE CROSS/BLUE SHIELD | Attending: Family Medicine | Primary: Family Medicine

## 2022-11-25 ENCOUNTER — Ambulatory Visit: Payer: BLUE CROSS/BLUE SHIELD | Attending: Family Medicine | Primary: Family Medicine

## 2022-11-25 DIAGNOSIS — Z0000 Encounter for general adult medical examination without abnormal findings: Secondary | ICD-10-CM

## 2022-11-25 NOTE — Progress Notes (Signed)
Darius Myers (DOB:  1996/07/13) is a 27 y.o. male here for evaluation of the following chief complaint(s):  Annual Exam    Assessment and Plan   1. Routine general medical examination at a health care facility  Comments:  Routine age appropriate counseling.    Return in about 1 year (around 11/26/2023) for Physical.  Subjective   History  Here for yearly physical, doing well.  Getting married - doing a [employer] cruise in November.  Objective   Physical Exam  Constitutional:       General: He is not in acute distress.     Appearance: Normal appearance.   HENT:      Head: Normocephalic and atraumatic.      Right Ear: Tympanic membrane, ear canal and external ear normal.      Left Ear: Tympanic membrane, ear canal and external ear normal.      Nose: No congestion or rhinorrhea.      Mouth/Throat:      Mouth: Mucous membranes are moist.      Pharynx: Oropharynx is clear. No oropharyngeal exudate or posterior oropharyngeal erythema.   Eyes:      Extraocular Movements: Extraocular movements intact.      Conjunctiva/sclera: Conjunctivae normal.      Pupils: Pupils are equal, round, and reactive to light.   Cardiovascular:      Rate and Rhythm: Normal rate and regular rhythm.      Pulses: Normal pulses.      Heart sounds: Normal heart sounds. No murmur heard.     No friction rub. No gallop.   Pulmonary:      Effort: Pulmonary effort is normal.      Breath sounds: Normal breath sounds. No wheezing, rhonchi or rales.   Abdominal:      General: Bowel sounds are normal. There is no distension.      Palpations: Abdomen is soft. There is no mass.      Tenderness: There is no abdominal tenderness.      Hernia: No hernia is present.   Musculoskeletal:         General: No swelling, tenderness or deformity. Normal range of motion.      Cervical back: Normal range of motion and neck supple. No tenderness.   Lymphadenopathy:      Cervical: No cervical adenopathy.   Skin:     General: Skin is warm and dry.      Findings: No lesion or rash.    Neurological:      General: No focal deficit present.      Mental Status: He is alert and oriented to person, place, and time. Mental status is at baseline.   Psychiatric:         Mood and Affect: Mood normal.         Behavior: Behavior normal.         Thought Content: Thought content normal.         Judgment: Judgment normal.       No results found for any visits on 11/25/22.  An electronic signature was used to authenticate this note.  Adair Patter, MD

## 2022-11-25 NOTE — Progress Notes (Signed)
Darius Myers (DOB:  [birth year]) is a 27 y.o. male here for evaluation of the following chief complaint(s):  Annual Exam    Assessment and Plan   1. Routine general medical examination at a health care facility  Comments:  Routine age appropriate counseling.    No follow-ups on file.  Subjective   History  Here for yearly review, doing well overall.  Starting to exercise, and packing meals.  Objective   Physical Exam  Constitutional:       General: He is not in acute distress.     Appearance: Normal appearance.   HENT:      Head: Normocephalic and atraumatic.      Right Ear: Tympanic membrane, ear canal and external ear normal.      Left Ear: Tympanic membrane, ear canal and external ear normal.      Nose: No congestion or rhinorrhea.      Mouth/Throat:      Mouth: Mucous membranes are moist.      Pharynx: Oropharynx is clear. No oropharyngeal exudate or posterior oropharyngeal erythema.   Eyes:      Extraocular Movements: Extraocular movements intact.      Conjunctiva/sclera: Conjunctivae normal.      Pupils: Pupils are equal, round, and reactive to light.   Cardiovascular:      Rate and Rhythm: Normal rate and regular rhythm.      Pulses: Normal pulses.      Heart sounds: Normal heart sounds. No murmur heard.     No friction rub. No gallop.   Pulmonary:      Effort: Pulmonary effort is normal.      Breath sounds: Normal breath sounds. No wheezing, rhonchi or rales.   Abdominal:      General: Bowel sounds are normal. There is no distension.      Palpations: Abdomen is soft. There is no mass.      Tenderness: There is no abdominal tenderness.      Hernia: No hernia is present.   Musculoskeletal:         General: No swelling, tenderness or deformity. Normal range of motion.      Cervical back: Normal range of motion and neck supple. No tenderness.   Lymphadenopathy:      Cervical: No cervical adenopathy.   Skin:     General: Skin is warm and dry.      Findings: No lesion or rash.   Neurological:      General: No focal  deficit present.      Mental Status: He is alert and oriented to person, place, and time. Mental status is at baseline.   Psychiatric:         Mood and Affect: Mood normal.         Behavior: Behavior normal.         Thought Content: Thought content normal.         Judgment: Judgment normal.       No results found for any visits on 11/25/22.  An electronic signature was used to authenticate this note.  Adair Patter, MD

## 2023-01-18 ENCOUNTER — Ambulatory Visit
Admit: 2023-01-18 | Discharge: 2023-01-18 | Payer: BLUE CROSS/BLUE SHIELD | Attending: Physician Assistant | Primary: Family Medicine

## 2023-01-18 DIAGNOSIS — J019 Acute sinusitis, unspecified: Secondary | ICD-10-CM

## 2023-01-18 MED ORDER — SACCHAROMYCES BOULARDII 250 MG PO CAPS
250 | ORAL_CAPSULE | Freq: Two times a day (BID) | ORAL | 0 refills | Status: AC
Start: 2023-01-18 — End: 2023-02-17

## 2023-01-18 MED ORDER — AMOXICILLIN-POT CLAVULANATE 875-125 MG PO TABS
875-125 | ORAL_TABLET | Freq: Two times a day (BID) | ORAL | 0 refills | Status: AC
Start: 2023-01-18 — End: 2023-01-25

## 2023-01-18 MED ORDER — PREDNISONE 5 MG PO TABS
5 | ORAL_TABLET | ORAL | 0 refills | Status: AC
Start: 2023-01-18 — End: 2023-01-23

## 2023-01-18 NOTE — Progress Notes (Signed)
ASSESSMENT/PLAN:     1. Acute sinusitis, recurrence not specified, unspecified location  -     amoxicillin-clavulanate (AUGMENTIN) 875-125 MG per tablet; Take 1 tablet by mouth 2 times daily for 7 days, Disp-14 tablet, R-0Normal  2. Lower respiratory infection (e.g., bronchitis, pneumonia, pneumonitis, pulmonitis)  -     amoxicillin-clavulanate (AUGMENTIN) 875-125 MG per tablet; Take 1 tablet by mouth 2 times daily for 7 days, Disp-14 tablet, R-0Normal  -     predniSONE (DELTASONE) 5 MG tablet; Take 6 tablets by mouth daily for 1 day, THEN 5 tablets daily for 1 day, THEN 4 tablets daily for 1 day, THEN 3 tablets daily for 1 day, THEN 2 tablets daily for 1 day, THEN 1 tablet daily for 1 day., Disp-21 tablet, R-0Normal  3. Antibiotics causing adverse effect in therapeutic use, initial encounter  -     saccharomyces boulardii (FLORASTOR) 250 MG capsule; Take 1 capsule by mouth 2 times daily, Disp-60 capsule, R-0Normal    Treating with a steroid taper and antibiotics.  Patient also requesting probiotics prescription due to antibiotic use.  Medications reviewed.  Continue supportive care including rest, hydration, over-the-counter medications.  Discussed signs and symptoms to monitor.  Follow-up if no improvement/worsening.        CHIEF COMPLAINT:  Darius Myers (DOB:  11-06-1995) is a 27 y.o. male, here for evaluation of the following chief complaint(s):  Sinus Problem (Sick for 10 days he has green stuff coming out of nose, facial pain took tylenol and sudafed x5 days and dayquil)      HPI:  Darius Myers is a 26 year old male presenting today with a cough, congestion, sinus pain/pressure that started 10 days ago.  This morning when he woke up, he was having some shortness of breath.  He has also had worsening sinus pain and pressure along with purulent nasal discharge.  No fevers or chills.  No wheezing.  Has been taking over-the-counter medications.          CURRENT MEDICATIONS:  Outpatient Medications Marked as Taking  for the 01/18/23 encounter (Office Visit) with Jason Nest, PA   Medication Sig Dispense Refill    amoxicillin-clavulanate (AUGMENTIN) 875-125 MG per tablet Take 1 tablet by mouth 2 times daily for 7 days 14 tablet 0    predniSONE (DELTASONE) 5 MG tablet Take 6 tablets by mouth daily for 1 day, THEN 5 tablets daily for 1 day, THEN 4 tablets daily for 1 day, THEN 3 tablets daily for 1 day, THEN 2 tablets daily for 1 day, THEN 1 tablet daily for 1 day. 21 tablet 0    saccharomyces boulardii (FLORASTOR) 250 MG capsule Take 1 capsule by mouth 2 times daily 60 capsule 0    Azelastine HCl 137 MCG/SPRAY SOLN       fluticasone (FLONASE) 50 MCG/ACT nasal spray USE 1 SPRAY(S) IN EACH NOSTRIL TWICE DAILY      EPINEPHRINE, ANAPHYLAXIS, IJ Inject as directed         REVIEW OF SYSTEMS:  Review of Systems   Constitutional:  Negative for chills and fever.   HENT:  Positive for congestion, sinus pressure and sinus pain. Negative for sore throat.    Respiratory:  Positive for cough and shortness of breath.    Cardiovascular:  Negative for chest pain.   Neurological:  Negative for dizziness and headaches.       ALLERGIES:  Allergies   Allergen Reactions    Ondansetron Other (See Comments)     Tongue  numbness  Tongue numbness        PAST MEDICAL HISTORY:  Past Medical History:   Diagnosis Date    ADHD     Environmental allergies     Severe- sees ENT       PAST SURGICAL HISTORY:  Past Surgical History:   Procedure Laterality Date    HERNIA REPAIR         FAMILY HISTORY:  Family History   Problem Relation Age of Onset    Other Mother         Muir-Torre Syndrome -- genetic predisposition for CRC    Hypertension Father     No Known Problems Sister     No Known Problems Brother     No Known Problems Maternal Grandmother     No Known Problems Maternal Grandfather     No Known Problems Paternal Grandmother     No Known Problems Paternal Grandfather     No Known Problems Other        TOBACCO HISTORY:  Social History     Tobacco Use    Smoking Status Never   Smokeless Tobacco Never   Tobacco Comments    Patient counselled on the dangers of tobacco 05/11/2020, Cessation: Advice given       SOCIAL HISTORY:  Social History     Socioeconomic History    Marital status: Single     Spouse name: None    Number of children: None    Years of education: None    Highest education level: None   Tobacco Use    Smoking status: Never    Smokeless tobacco: Never    Tobacco comments:     Patient counselled on the dangers of tobacco 05/11/2020, Cessation: Advice given   Vaping Use    Vaping Use: Never used   Substance and Sexual Activity    Alcohol use: Yes     Comment: once ever 6-41mo    Drug use: Never    Sexual activity: Yes     Partners: Male       OB HISTORY:  OB History   No obstetric history on file.       VITAL SIGNS:  Vitals:    01/18/23 1111   BP: 109/78   Pulse: (!) 102   Resp: 18   Temp: 97.9 F (36.6 C)   SpO2: 98%   Weight: 77.7 kg (171 lb 6.4 oz)   Height: 1.778 m ( )       PHYSICAL EXAM:  Physical Exam  Vitals reviewed.   Constitutional:       General: He is not in acute distress.  HENT:      Head: Normocephalic and atraumatic.      Comments: B/l sinus tenderness  Eyes:      Extraocular Movements: Extraocular movements intact.      Conjunctiva/sclera: Conjunctivae normal.   Cardiovascular:      Rate and Rhythm: Normal rate and regular rhythm.      Heart sounds: No murmur heard.     No friction rub. No gallop.   Pulmonary:      Effort: Pulmonary effort is normal. No respiratory distress.      Breath sounds: Normal breath sounds. No wheezing or rhonchi.   Skin:     General: Skin is warm and dry.   Neurological:      General: No focal deficit present.      Mental Status: He is alert and oriented to person, place, and  time.   Psychiatric:         Mood and Affect: Mood normal.         Behavior: Behavior normal.                An electronic signature was used to authenticate this note.    --Zenaida Deed, PA

## 2023-02-11 ENCOUNTER — Encounter
Admit: 2023-02-11 | Discharge: 2023-02-11 | Payer: BLUE CROSS/BLUE SHIELD | Attending: Family Medicine | Primary: Family Medicine

## 2023-02-11 DIAGNOSIS — J4520 Mild intermittent asthma, uncomplicated: Secondary | ICD-10-CM

## 2023-02-11 MED ORDER — ALBUTEROL SULFATE HFA 108 (90 BASE) MCG/ACT IN AERS
108 (90 Base) MCG/ACT | Freq: Four times a day (QID) | RESPIRATORY_TRACT | 11 refills | Status: DC | PRN
Start: 2023-02-11 — End: 2023-07-09

## 2023-03-04 NOTE — Progress Notes (Signed)
Darius Myers (DOB:  1996/03/06) is a 27 y.o. male, here for evaluation of the following chief complaint(s):  Shortness of Breath (Intermittently. Pt stated this happened after he was sick recently) and Migraine (Pt noted migraines in the past 3-4wk. )    Vitals:    02/11/23 1341   BP: 108/62   Pulse: 98   SpO2: 99%   Weight: 80.3 kg (177 lb)   Height: 1.778 m (5\' 10" )      Assessment & Plan   1. Mild intermittent asthma without complication  -     albuterol sulfate HFA (PROVENTIL HFA) 108 (90 Base) MCG/ACT inhaler; Inhale 2 puffs into the lungs every 6 hours as needed for Wheezing or Shortness of Breath, Disp-18 g, R-11Normal  2. Seasonal allergies    Assessment & Plan  1. Sinusitis.  The patient's symptoms suggest a possible exacerbation of his sinus allergies. The patient is advised to use an over-the-counter antihistamine to alleviate sinus drainage.    2. Mild intermittent asthma.  The patient's airways sound clear today. The patient is prescribed an albuterol inhaler to be used as needed for wheezing, chest tightness, or shortness of breath. Should the patient's symptoms persist, a pulmonary function test will be considered.     3.  Headaches  Suspect related to caffeine withdrawal.  He agrees.  Will monitor intake and notify if persistent or develops red flag symptoms.  No follow-ups on file.     Subjective   Shortness of Breath    Migraine       History of Present Illness  The patient presents for evaluation of multiple medical concerns.    Approximately 3 to 4 weeks ago, the patient sought medical attention at an urgent care facility where he was diagnosed with a sinus infection. However, the possibility of pneumonia, bronchitis, or both could not be ruled out. A 5-day course of Z-Pak was initiated, and the patient's condition improved. The patient reports intermittent episodes of shortness of breath, even at rest, which he attributes to the duration of his recovery from bronchitis or pneumonia. He initially  suspected COVID-19, but his COVID-19 tests returned negative results. He recalls being on an inhaler for a brief period during his childhood.    Over the past 3 to 4 weeks, the patient has been experiencing severe headaches upon waking in the morning, both at work and around 2:30 PM. These headaches are a new symptom for him. He also experiences nausea, which he attributes to inadequate food intake, but the nausea persists. Over-the-counter Tylenol has proven ineffective, but the patient prefers to avoid daily use of Tylenol or Motrin due to concerns about daily safety. The patient spends more time outdoors at work. He has reduced his caffeine intake from 14 to 15 drinks per day to 1 to 2 drinks per day. He has been gradually reducing his caffeine intake over the past 2 years.    The patient is currently using Flonase nasal spray and received two allergy injections yesterday, which have provided significant relief. He has discontinued over-the-counter medications. He has not previously tried Singulair. His antibiotic regimen has caused gastrointestinal upset. His fianc and neighbor fell ill around the time of his illness. His throat is sore, but it is not as severe as the first time.    Supplemental Information  He denies any burning or frequency with urination. He has 1 or 2 liquid bowel movements, which he attributes to his diet. He avoids greasy foods and eats red  meat once or twice a week as it causes his stomach to get upset.         Objective   Physical Exam  Constitutional:       General: He is not in acute distress.     Appearance: Normal appearance.   HENT:      Head: Normocephalic and atraumatic.      Nose: Congestion and rhinorrhea present.      Mouth/Throat:      Mouth: Mucous membranes are moist.      Pharynx: Oropharynx is clear. No oropharyngeal exudate or posterior oropharyngeal erythema.   Eyes:      Extraocular Movements: Extraocular movements intact.      Pupils: Pupils are equal, round, and  reactive to light.   Cardiovascular:      Rate and Rhythm: Normal rate and regular rhythm.      Heart sounds: No murmur heard.     No friction rub. No gallop.   Pulmonary:      Effort: Pulmonary effort is normal.      Breath sounds: Normal breath sounds. No wheezing, rhonchi or rales.   Musculoskeletal:         General: No deformity. Normal range of motion.      Cervical back: Normal range of motion and neck supple.   Skin:     General: Skin is warm and dry.   Neurological:      General: No focal deficit present.      Mental Status: He is alert and oriented to person, place, and time.   Psychiatric:         Mood and Affect: Mood normal.         Behavior: Behavior normal.         Thought Content: Thought content normal.         Judgment: Judgment normal.        Physical Exam          Results       Office Visit on 04/10/2022   Component Date Value Ref Range Status    SARS-CoV-2 04/10/2022 Not Detected  Not Detected Final    Influenza A 04/10/2022 Not Detected  Not Detected Final    Influenza B 04/10/2022 Not Detected  Not Detected Final    Valid Internal Control, POC 04/10/2022 yes   Final    Group A Strep Antigen, POC 04/10/2022 Positive  Negative Final   Telephone on 02/18/2022   Component Date Value Ref Range Status    Chlamydia Trachomatis, NAA, Nasoph* 02/19/2022 Negative  Negative Final    Neisseria Gonorrhoeae, NAA, Nasoph* 02/19/2022 Negative  Negative Final   Orders Only on 02/13/2022   Component Date Value Ref Range Status    Test Name 02/13/2022 CGT throat   Final    Reference Lab 02/13/2022 [facility]   Final    test code 02/13/2022 [id number]   Final    Test Results 02/13/2022 Seperate Report   Final   Hospital Outpatient Visit on 02/13/2022   Component Date Value Ref Range Status    HIV Screen 02/13/2022 Negative  Negative Final    RPR 02/13/2022 Non-Reactive  Non-Reactive Final   Office Visit on 02/13/2022   Component Date Value Ref Range Status    Mononucleosis Screen, POC 02/13/2022 Negative   Final    Group  A Strep Antigen, POC 02/13/2022 Negative  Negative Final   Office Visit on 10/13/2021   Component Date Value Ref Range Status  SARS-CoV-2 10/13/2021 Not Detected  Not Detected Final    Influenza A 10/13/2021 Not Detected  Not Detected Final    Influenza B 10/13/2021 Not Detected  Not Detected Final   Office Visit on 05/26/2021   Component Date Value Ref Range Status    Group A Strep Antigen, POC 05/26/2021 Negative  Negative Final   Orders Only on 05/15/2021   Component Date Value Ref Range Status    WBC 05/15/2021 5.4  3.8 - 10.6 x10e3/mcL Final    RBC 05/15/2021 4.98  4.00 - 5.60 x10e6/mcL Final    Hemoglobin 05/15/2021 15.6  13.0 - 17.3 g/dL Final    Hematocrit 16/07/9603 44.1  38.0 - 52.0 % Final    MCV 05/15/2021 88.6  84.0 - 100.0 fL Final    MCH 05/15/2021 31.3  27.0 - 34.5 pg Final    MCHC 05/15/2021 35.4  32.0 - 36.0 g/dL Final    RDW 54/06/8118 11.8  11.0 - 16.0 % Final    Platelets 05/15/2021 262  140 - 440 x10e3/mcL Final    MPV 05/15/2021 10.4  7.2 - 13.2 fL Final    Neutrophils % 05/15/2021 47.4  42.0 - 74.0 % Final    Lymphocytes 05/15/2021 39.3  15.0 - 45.0 % Final    Monocytes % 05/15/2021 6.6  4.0 - 12.0 % Final    Eosinophils % 05/15/2021 5.7  0.0 - 7.0 % Final    Basophils % 05/15/2021 0.6  0.0 - 2.0 % Final    Neutrophils Absolute 05/15/2021 2.6  1.6 - 7.3 x10e3/mcL Final    Lymphocytes Absolute 05/15/2021 2.1  1.0 - 3.2 x10e3/mcL Final    Monocytes Absolute 05/15/2021 0.4  0.3 - 1.0 x10e3/mcL Final    Eosinophils Absolute 05/15/2021 0.3  0.0 - 0.5 x10e3/mcL Final    Basophils Absolute 05/15/2021 0.0  0.0 - 0.2 x10e3/mcL Final    Immature Granulocytes % 05/15/2021 0.4  0.0 - 0.6 % Final    Immature Grans (Abs) 05/15/2021 0.02  0.00 - 0.06 x10e3/mcL Final    Cholesterol 05/15/2021 131  100 - 200 mg/dL Final    HDL 14/78/2956 50  >=40 mg/dL Final    Triglycerides 05/15/2021 61  0 - 149 mg/dL Final    LDL Cholesterol 05/15/2021 69.0  0.0 - 100.0 mg/dL Final    LDL/HDL Ratio 05/15/2021 1.0   Final     Chol/HDL Ratio 05/15/2021 2.6  0.0 - 4.4 Final    VLDL 05/15/2021 12.2  5.0 - 40.0 mg/dL Final    Sodium 21/30/8657 142  135 - 145 mmol/L Final    Potassium 05/15/2021 4.6  3.5 - 5.3 mmol/L Final    Chloride 05/15/2021 107  98 - 107 mmol/L Final    CO2 05/15/2021 25  22 - 29 mmol/L Final    Glucose 05/15/2021 80  70 - 99 mg/dL Final    BUN 84/69/6295 16  6 - 20 mg/dL Final    Creatinine 28/41/3244 0.8  0.7 - 1.3 mg/dL Final    Anion Gap 10/08/7251 10  2 - 17 mmol/L Final    Osmolaliy Calculated 05/15/2021 283  270 - 287 mOsm/kg Final    Calcium 05/15/2021 9.6  8.6 - 10.0 mg/dL Final    Total Protein 05/15/2021 7.0  6.4 - 8.3 g/dL Final    Albumin 66/44/0347 5.0  3.5 - 5.2 g/dL Final    Globulin 42/59/5638 2.0  1.9 - 4.4 g/dL Final    Albumin/Globulin Ratio 05/15/2021 2.00  1.00 -  2.70 Final    Total Bilirubin 05/15/2021 0.62  0.00 - 1.20 mg/dL Final    Alk Phosphatase 05/15/2021 51  40 - 130 unit/L Final    AST 05/15/2021 14  0 - 50 unit/L Final    ALT 05/15/2021 13  0 - 50 unit/L Final    Est, Glom Filt Rate 05/15/2021 127  >=90 mL/min/1.2m Final    Lipase 05/15/2021 16  13 - 60 unit/L Final      Office Visit on 04/10/2022   Component Date Value Ref Range Status    SARS-CoV-2 04/10/2022 Not Detected  Not Detected Final    Comment: Test performed at [facility] [place],  [place] [address]  The cobas SARS-CoV-2 & Influenza A/B Nucleic acid test for use on the cobas  Liat System (cobas SARS-CoV-2 & Influenza A/B) is an automated multiplex  real-time RT-PCR assay intended for the simultaneous rapid in vitro  qualitative detection and differentiation of SARS-CoV-2, influenza A, and  influenza B virus RNA in healthcare provider-collected nasopharyngeal and  nasal swabs, from individuals suspected of respiratory viral infection  consistent with COVID-19 by their healthcare provider.  Cobas SARS-CoV-2 & Influenza A/B is intended for use in the simultaneous rapid  in vitro detection and  differentiation of SARS-CoV-2, influenza A virus, and  influenza B virus nucleic acids in clinical specimens and is not intended to  detect influenza C virus. SARS-CoV-2, influenza A and influenza B viral RNA is  generally detectable in respiratory specimens during the acute phase of  infection. Positive resul                           ts are indicative of active infection but do not rule  out bacterial infection or co-infection with other pathogens not detected by  the test. Clinical correlation with patient history and other diagnostic  information is necessary to determine patient infection status. The agent  detected may not be the definite cause of disease.  Negative results do not preclude SARS-CoV-2, influenza A and/or influenza B  infection and should not be used as the sole basis for diagnosis, treatment or  other patient management decisions. Negative results must be combined with  clinical observations, patient history, and/or epidemiological information.  The cobas SARS-CoV-2 & Influenza A/B Nucleic acid test is only for use under  the Food and Drug Administration's Emergency Use Authorization.      Influenza A 04/10/2022 Not Detected  Not Detected Final    Comment: Test performed at [facility], [place],  [place] [address]      Influenza B 04/10/2022 Not Detected  Not Detected Final    Comment: Test performed at [facility], [place],  [place] [address]      Valid Internal Control, POC 04/10/2022 yes   Final    Group A Strep Antigen, POC 04/10/2022 Positive  Negative Final      No results found for this visit on 02/11/23.   No results found for any visits on 02/11/23.  Allergies   Allergen Reactions    Ondansetron Other (See Comments)     Tongue numbness  Tongue numbness      Prior to Admission medications    Medication Sig Start Date End Date Taking? Authorizing Provider   albuterol sulfate HFA (PROVENTIL HFA) 108 (90 Base) MCG/ACT inhaler Inhale 2  puffs into the lungs every 6 hours as needed  for Wheezing or Shortness of Breath 02/11/23  Yes Quierra Silverio, Noreene Filbert, MD   Azelastine HCl 137 MCG/SPRAY SOLN  02/15/21  Yes [provider]   fluticasone (FLONASE) 50 MCG/ACT nasal spray USE 1 SPRAY(S) IN EACH NOSTRIL TWICE DAILY 02/15/21  Yes [provider]   EPINEPHRINE, ANAPHYLAXIS, IJ Inject as directed   Yes [provider]      Family History   Problem Relation Age of Onset    Other Mother         Muir-Torre Syndrome -- genetic predisposition for CRC    Hypertension Father     No Known Problems Sister     No Known Problems Brother     No Known Problems Maternal Grandmother     No Known Problems Maternal Grandfather     No Known Problems Paternal Grandmother     No Known Problems Paternal Grandfather     No Known Problems Other       Social History     Socioeconomic History    Marital status: Single     Spouse name: Not on file    Number of children: Not on file    Years of education: Not on file    Highest education level: Not on file   Occupational History    Not on file   Tobacco Use    Smoking status: Never    Smokeless tobacco: Never    Tobacco comments:     Patient counselled on the dangers of tobacco 05/11/2020, Cessation: Advice given   Vaping Use    Vaping Use: Never used   Substance and Sexual Activity    Alcohol use: Yes     Comment: once ever 6-32mo    Drug use: Never    Sexual activity: Yes     Partners: Male   Other Topics Concern    Not on file   Social History Narrative    Not on file     Social Determinants of Health     Financial Resource Strain: Not on file   Food Insecurity: Not on file   Transportation Needs: Not on file   Physical Activity: Not on file   Stress: Not on file   Social Connections: Not on file   Intimate Partner Violence: Not on file   Housing Stability: Not on file      Past Surgical History:   Procedure Laterality Date    HERNIA REPAIR        Past Medical History:   Diagnosis Date    ADHD     Environmental  allergies     Severe- sees ENT            The patient (or guardian, if applicable) and other individuals in attendance with the patient were advised that Artificial Intelligence will be utilized during this visit to record and process the conversation to generate a clinical note. The patient (or guardian, if applicable) and other individuals in attendance at the appointment consented to the use of AI, including the recording.      An electronic signature was used to authenticate this note.  --Laurence Ferrari, MD
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
## 2023-04-06 NOTE — Telephone Encounter (Signed)
Please call pt. And Let them know there are no appts. And schedule them in the first available spot.    Thanks,

## 2023-04-08 ENCOUNTER — Ambulatory Visit: Admit: 2023-04-08 | Discharge: 2023-04-08 | Payer: BLUE CROSS/BLUE SHIELD | Primary: Family Medicine

## 2023-04-08 ENCOUNTER — Ambulatory Visit
Admit: 2023-04-08 | Discharge: 2023-04-08 | Payer: BLUE CROSS/BLUE SHIELD | Attending: Surgical | Primary: Family Medicine

## 2023-04-08 ENCOUNTER — Ambulatory Visit: Admit: 2023-04-08 | Discharge: 2023-04-08 | Payer: BLUE CROSS/BLUE SHIELD | Attending: Family | Primary: Family Medicine

## 2023-04-08 VITALS — Ht 70.0 in | Wt 174.0 lb

## 2023-04-08 DIAGNOSIS — M542 Cervicalgia: Secondary | ICD-10-CM

## 2023-04-08 DIAGNOSIS — S43302A Subluxation of unspecified parts of left shoulder girdle, initial encounter: Principal | ICD-10-CM

## 2023-04-08 MED ORDER — CYCLOBENZAPRINE HCL 10 MG PO TABS
10 | ORAL_TABLET | Freq: Two times a day (BID) | ORAL | 0 refills | Status: AC | PRN
Start: 2023-04-08 — End: 2023-04-18

## 2023-04-08 MED ORDER — METHYLPREDNISOLONE 4 MG PO TBPK
4 | ORAL_TABLET | ORAL | 0 refills | Status: AC
Start: 2023-04-08 — End: 2023-04-14

## 2023-04-08 MED ORDER — DICLOFENAC SODIUM 75 MG PO TBEC
75 | ORAL_TABLET | Freq: Two times a day (BID) | ORAL | 0 refills | Status: DC
Start: 2023-04-08 — End: 2023-04-19
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
## 2023-04-19 ENCOUNTER — Ambulatory Visit
Admit: 2023-04-19 | Discharge: 2023-04-19 | Payer: BLUE CROSS/BLUE SHIELD | Attending: Physician Assistant | Primary: Family Medicine

## 2023-04-19 DIAGNOSIS — J029 Acute pharyngitis, unspecified: Secondary | ICD-10-CM

## 2023-04-19 LAB — POC COVID-19 & INFLUENZA COMBO (LIAT IN HOUSE)
Influenza A: NOT DETECTED
Influenza B: NOT DETECTED
SARS-CoV-2: DETECTED — AB

## 2023-04-19 LAB — AMB POC RAPID STREP A: Group A Strep Antigen, POC: NEGATIVE

## 2023-04-19 MED ORDER — ALBUTEROL SULFATE HFA 108 (90 BASE) MCG/ACT IN AERS
108 | Freq: Four times a day (QID) | RESPIRATORY_TRACT | 0 refills | Status: AC | PRN
Start: 2023-04-19 — End: ?

## 2023-04-19 NOTE — Patient Instructions (Signed)
Your COVID test was positive in clinic today.   Return to work or school criteria: >24 hours without fever and without use of fever reducing products and symptoms improved.   Tylenol or Motrin if needed and able to take.  Stay well hydrated.  Separation from other household members (staying in a separate room when possible and wearing a mask when in the same room)  Frequent hand washing for all family members  Frequent disinfection of commonly touched surfaces  If symptoms become severe, you develop chest pain, difficulty breathing, or any other alarming signs or symptoms, go to the ED at that time.

## 2023-04-19 NOTE — Progress Notes (Signed)
ASSESSMENT/PLAN:     1. Sore throat  -     POC COVID-19 & Influenza Combo ([facility] in House); Future  -     POC Strep A Assay w/Optic (36644)  2. COVID-19  -     albuterol sulfate HFA (VENTOLIN HFA) 108 (90 Base) MCG/ACT inhaler; Inhale 2 puffs into the lungs 4 times daily as needed for Wheezing, Disp-18 g, R-0Normal      Results for orders placed or performed in visit on 04/19/23   POC COVID-19 & Influenza Combo ([facility] in House)   Result Value Ref Range    SARS-CoV-2 Detected (A) Not Detected    Influenza A Not Detected Not Detected    Influenza B Not Detected Not Detected    Narrative    Pregnant:->No   POC Strep A Assay w/Optic (03474)   Result Value Ref Range    Valid Internal Control, POC pass     Group A Strep Antigen, POC Negative      Covid testing positive.  Sending in an inhaler to use as needed.  Recommend supportive care, including over-the-counter medications as needed.  Tylenol and/or motrin for fever/aches.  Rest, stay hydrated.  Discussed signs or symptoms to monitor.  ER precautions given.  Reviewed CDC guidelines for quarantine for COVID.  Follow-up if no improvement/worsening symptoms.             CHIEF COMPLAINT:  Darius Myers (DOB:  November 12, 1995) is a 27 y.o. male, here for evaluation of the following chief complaint(s):  Pharyngitis (SOB, Dizziness, Nausea, Joint pain x 1 day )      HPI:  Darius Myers is a 27 year old male presenting today with a cough, congestion, chills, dizziness, and shortness of breath that started yesterday.  He is also having some bodyaches and a sore throat.  No definite fevers.  No difficulty swallowing.  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      Pharyngitis  Associated symptoms: congestion, cough, headaches, myalgias, shortness of breath and sore throat    Associated symptoms: no chest pain and no fever        CURRENT MEDICATIONS:  Outpatient Medications Marked as Taking for the 04/19/23 encounter (Office Visit) with Jason Nest, PA   Medication Sig Dispense Refill     DICLOFENAC PO Take by mouth      albuterol sulfate HFA (VENTOLIN HFA) 108 (90 Base) MCG/ACT inhaler Inhale 2 puffs into the lungs 4 times daily as needed for Wheezing 18 g 0    Azelastine HCl 137 MCG/SPRAY SOLN       fluticasone (FLONASE) 50 MCG/ACT nasal spray USE 1 SPRAY(S) IN EACH NOSTRIL TWICE DAILY      EPINEPHRINE, ANAPHYLAXIS, IJ Inject as directed         REVIEW OF SYSTEMS:  Review of Systems   Constitutional:  Positive for chills. Negative for fever.   HENT:  Positive for congestion and sore throat.    Respiratory:  Positive for cough and shortness of breath.    Cardiovascular:  Negative for chest pain.   Musculoskeletal:  Positive for myalgias.   Neurological:  Positive for dizziness and headaches.       ALLERGIES:  Allergies   Allergen Reactions    Ondansetron Other (See Comments)     Tongue numbness  Tongue numbness        PAST MEDICAL HISTORY:  Past Medical History:   Diagnosis Date    ADHD     Environmental allergies  Severe- sees ENT       PAST SURGICAL HISTORY:  Past Surgical History:   Procedure Laterality Date    HERNIA REPAIR         FAMILY HISTORY:  Family History   Problem Relation Age of Onset    Other Mother         Muir-Torre Syndrome -- genetic predisposition for CRC    Hypertension Father     No Known Problems Sister     No Known Problems Brother     No Known Problems Maternal Grandmother     No Known Problems Maternal Grandfather     No Known Problems Paternal Grandmother     No Known Problems Paternal Grandfather     No Known Problems Other        TOBACCO HISTORY:  Social History     Tobacco Use   Smoking Status Never   Smokeless Tobacco Never   Tobacco Comments    Patient counselled on the dangers of tobacco 05/11/2020, Cessation: Advice given       SOCIAL HISTORY:  Social History     Socioeconomic History    Marital status: Single     Spouse name: None    Number of children: None    Years of education: None    Highest education level: None   Tobacco Use    Smoking status: Never     Smokeless tobacco: Never    Tobacco comments:     Patient counselled on the dangers of tobacco 05/11/2020, Cessation: Advice given   Vaping Use    Vaping Use: Never used   Substance and Sexual Activity    Alcohol use: Yes     Comment: once ever 6-29mo    Drug use: Never    Sexual activity: Yes     Partners: Male       OB HISTORY:  OB History   No obstetric history on file.       VITAL SIGNS:  Vitals:    04/19/23 1200   BP: 113/64   Pulse: (!) 104   Resp: 18   Temp: 97.9 F (36.6 C)   SpO2: 100%   Weight: 78.9 kg (174 lb)       PHYSICAL EXAM:  Physical Exam  Vitals reviewed.   Constitutional:       General: He is not in acute distress.     Appearance: He is not toxic-appearing.   HENT:      Head: Normocephalic and atraumatic.      Right Ear: Tympanic membrane normal.      Left Ear: Tympanic membrane normal.      Mouth/Throat:      Pharynx: Posterior oropharyngeal erythema present. No oropharyngeal exudate.   Eyes:      Extraocular Movements: Extraocular movements intact.      Conjunctiva/sclera: Conjunctivae normal.   Cardiovascular:      Rate and Rhythm: Normal rate and regular rhythm.      Heart sounds: No murmur heard.     No friction rub. No gallop.   Pulmonary:      Effort: Pulmonary effort is normal. No respiratory distress.      Breath sounds: Normal breath sounds. No wheezing or rhonchi.   Skin:     General: Skin is warm and dry.   Neurological:      General: No focal deficit present.      Mental Status: He is alert and oriented to person, place, and time.   Psychiatric:  Mood and Affect: Mood normal.         Behavior: Behavior normal.                An electronic signature was used to authenticate this note.    --Jason Nest, PA

## 2023-04-23 ENCOUNTER — Encounter

## 2023-04-23 ENCOUNTER — Ambulatory Visit
Admit: 2023-04-23 | Discharge: 2023-04-23 | Payer: BLUE CROSS/BLUE SHIELD | Attending: Family Medicine | Primary: Family Medicine

## 2023-04-23 DIAGNOSIS — J029 Acute pharyngitis, unspecified: Secondary | ICD-10-CM

## 2023-04-23 LAB — AMB POC RAPID STREP A: Group A Strep Antigen, POC: NEGATIVE

## 2023-04-23 NOTE — Patient Instructions (Signed)
Rapid strep negative; culture sent out for confirmation. Patient advised we will notify him of results in 24 to 48 hours.   Explained that even though the rapid strep test was negative today, the culture could show infection with a different type of strep, that could still be treated.   If his symptoms are related to his COVID diagnosis, then this would not show up on a culture, which is looking for bacteria.  Supportive care measures discussed, including Chloraseptic spray, Cepacol drops, and zinc lozenges.  He is welcome to use some Tylenol and ibuprofen as needed to help with the throat pain.    You will receive a follow up survey after your visit, usually by e-mail, or by text link.    Please take the time to tell us what you thought of your visit - what we did well...any staff that made your day! Or ways that we can improve!   We also welcome Google reviews!  Thanks for helping [place] be the best [facility] possible!

## 2023-04-23 NOTE — Progress Notes (Signed)
Chief Complaint  Chief Complaint   Patient presents with    Pharyngitis        Subjective     HPI:  Darius Myers with (DOB:  April 09, 1996) is a 27 y.o. male, here for evaluation of the following chief complaint(s):  Pharyngitis    Patient presents with an increasingly annoying sore throat, that started with his other COVID symptoms over the weekend.  He states it has woken him up at night for the past 2 nights.  He was concerned he may have picked up strep in the process.  He states that his pain does get better when he takes Tylenol and ibuprofen.  He states he is on Paxlovid for his COVID infection, which was diagnosed on 7/14.  States this morning, he thought he saw some white spots in the back of his throat, which concerned him more for strep.  He states he drank some fluids this morning, and it looked like the white spots went away.      History provided by:  Patient  Language interpreter used: No         Allergies   Allergen Reactions    Ondansetron Other (See Comments)     Tongue numbness  Tongue numbness     Past Medical History:   Diagnosis Date    ADHD     Environmental allergies     Severe- sees ENT     Past Surgical History:   Procedure Laterality Date    HERNIA REPAIR       Current Medications:  Current Outpatient Medications   Medication Sig Dispense Refill    DICLOFENAC PO Take by mouth      albuterol sulfate HFA (VENTOLIN HFA) 108 (90 Base) MCG/ACT inhaler Inhale 2 puffs into the lungs 4 times daily as needed for Wheezing 18 g 0    pantoprazole (PROTONIX) 20 MG tablet       Azelastine HCl 137 MCG/SPRAY SOLN       fluticasone (FLONASE) 50 MCG/ACT nasal spray USE 1 SPRAY(S) IN EACH NOSTRIL TWICE DAILY      EPINEPHRINE, ANAPHYLAXIS, IJ Inject as directed      albuterol sulfate HFA (PROVENTIL HFA) 108 (90 Base) MCG/ACT inhaler Inhale 2 puffs into the lungs every 6 hours as needed for Wheezing or Shortness of Breath (Patient not taking: Reported on 04/19/2023) 18 g 11     No current facility-administered  medications for this visit.        Review of System:  Review of Systems    All other systems negative other than noted in HPI         Objective     BP 126/88   Pulse 74   Temp 97.6 F (36.4 C) (Oral)   Resp 18   Ht 1.778 m (5\' 10" )   Wt 79 kg (174 lb 1.6 oz)   SpO2 99%   BMI 24.98 kg/m     Physical Exam:  Physical Exam  Vitals and nursing note reviewed.   Constitutional:       Appearance: Normal appearance.   HENT:      Head: Normocephalic and atraumatic.      Mouth/Throat:      Mouth: Mucous membranes are moist.      Pharynx: Posterior oropharyngeal erythema present. No oropharyngeal exudate.   Eyes:      Conjunctiva/sclera: Conjunctivae normal.   Pulmonary:      Effort: Pulmonary effort is normal. No respiratory distress.   Skin:  General: Skin is warm and dry.      Coloration: Skin is pale.      Findings: No rash.   Neurological:      General: No focal deficit present.      Mental Status: He is alert.      Cranial Nerves: No cranial nerve deficit.               Assessment & Plan   ASSESSMENT/PLAN:    ICD-10-CM    1. Sore throat  J02.9 POC Strep A Assay w/Optic (16109)     Culture, Throat        1. Sore throat  -     POC Strep A Assay w/Optic (60454)  -     Culture, Throat; Future    Allergies reviewed with patient for accuracy, and any errors were corrected.   No results found for this visit on 04/23/23.      Patient Instructions   Rapid strep negative; culture sent out for confirmation. Patient advised we will notify him of results in 24 to 48 hours.   Explained that even though the rapid strep test was negative today, the culture could show infection with a different type of strep, that could still be treated.   If his symptoms are related to his COVID diagnosis, then this would not show up on a culture, which is looking for bacteria.  Supportive care measures discussed, including Chloraseptic spray, Cepacol drops, and zinc lozenges.  He is welcome to use some Tylenol and ibuprofen as needed to help  with the throat pain.    You will receive a follow up survey after your visit, usually by e-mail, or by text link.    Please take the time to tell us what you thought of your visit - what we did well...any staff that made your day! Or ways that we can improve!   We also welcome Google reviews!  Thanks for helping [place] be the best [facility] possible!       Return if symptoms worsen or fail to improve.       An electronic signature was used to authenticate this note.    --Verlee Rossetti, MD

## 2023-04-25 LAB — CULTURE, THROAT: FINAL REPORT: NORMAL

## 2023-04-26 NOTE — Telephone Encounter (Signed)
Pt has mychart and was able to see results should he have any concerns he may contact the office.

## 2023-05-18 ENCOUNTER — Encounter

## 2023-05-18 ENCOUNTER — Encounter
Admit: 2023-05-18 | Discharge: 2023-05-18 | Payer: BLUE CROSS/BLUE SHIELD | Attending: Surgical | Primary: Family Medicine

## 2023-05-18 DIAGNOSIS — S43432D Superior glenoid labrum lesion of left shoulder, subsequent encounter: Secondary | ICD-10-CM
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
## 2023-05-26 ENCOUNTER — Ambulatory Visit: Payer: BLUE CROSS/BLUE SHIELD | Primary: Family Medicine

## 2023-05-30 ENCOUNTER — Ambulatory Visit: Admit: 2023-05-30 | Discharge: 2023-05-30 | Payer: BLUE CROSS/BLUE SHIELD | Attending: Family | Primary: Family Medicine

## 2023-05-30 DIAGNOSIS — B9689 Other specified bacterial agents as the cause of diseases classified elsewhere: Secondary | ICD-10-CM

## 2023-05-30 LAB — POC HETEROPHILE ANTIBODY SCREEN: Mononucleosis Screen, POC: NEGATIVE

## 2023-05-30 LAB — AMB POC RAPID STREP A: Group A Strep Antigen, POC: NEGATIVE

## 2023-05-30 MED ORDER — AMOXICILLIN 875 MG PO TABS
875 | ORAL_TABLET | Freq: Two times a day (BID) | ORAL | 0 refills | Status: AC
Start: 2023-05-30 — End: 2023-06-09

## 2023-05-30 NOTE — Patient Instructions (Signed)
Strep/Mono test results discussed while in clinic.     Will send out a throat culture and GC/Chlamydia of the throat. Call with results.     Discussed need for antibiotics. Take the full course of antibiotics, unless advised otherwise by a health care provider.     You may take OTC Chloraseptic Max sore throat lozenges, may also take Tylenol/Ibuprofen as needed for fever/aches/pains.     You should also use supportive measures, including salt water gargles, tea with honey/lemon, marshmallows, and salty foods to reduce symptoms.     After 48 hours on antibiotics please either change out toothbrush or boil toothbrush to sterilize.     Please continue to monitor symptoms and return to the clinic with any new symptoms or with any concerns.     Patient expressed understanding and agrees with the plan. All questions answered.

## 2023-05-30 NOTE — Progress Notes (Signed)
ASSESSMENT/PLAN:    1. Acute bacterial pharyngitis  -     amoxicillin (AMOXIL) 875 MG tablet; Take 1 tablet by mouth 2 times daily for 10 days, Disp-20 tablet, R-0Normal  2. Sore throat  -     POC Strep A Assay w/Optic (54098)  -     POC HETEROPHILE ANTIBODY SCREEN  -     Culture, Throat  -     MISCELLANEOUS SENDOUT    Orders Placed This Encounter   Procedures    Culture, Throat    MISCELLANEOUS SENDOUT     Throat swab for gonorrhea/chlamydia; (1191478)    POC Strep A Assay w/Optic (29562)    POC HETEROPHILE ANTIBODY SCREEN     Results for orders placed or performed in visit on 05/30/23   POC Strep A Assay w/Optic (13086)   Result Value Ref Range    Valid Internal Control, POC PASS     Group A Strep Antigen, POC Negative    POC HETEROPHILE ANTIBODY SCREEN   Result Value Ref Range    Internal Control PASS     Mononucleosis Screen, POC NEGATIVE        Patient Instructions   Strep/Mono test results discussed while in clinic.     Will send out a throat culture and GC/Chlamydia of the throat. Call with results.     Discussed need for antibiotics. Take the full course of antibiotics, unless advised otherwise by a health care provider.     You may take OTC Chloraseptic Max sore throat lozenges, may also take Tylenol/Ibuprofen as needed for fever/aches/pains.     You should also use supportive measures, including salt water gargles, tea with honey/lemon, marshmallows, and salty foods to reduce symptoms.     After 48 hours on antibiotics please either change out toothbrush or boil toothbrush to sterilize.     Please continue to monitor symptoms and return to the clinic with any new symptoms or with any concerns.     Patient expressed understanding and agrees with the plan. All questions answered.       Return if symptoms worsen or fail to improve.      SUBJECTIVE/OBJECTIVE:  Darius Myers (DOB:  August 18, 1996) is a 27 y.o. male, here for evaluation of the following chief complaint(s):  Fever (100.4) and Pharyngitis (X 05/18/23  tonsils are swollen / )      HPI: Darius Myers is a 27 y.o. male with complaints of sore throat, swollen tonsils, and fever (tmax 100.4).     He states that he started to have a bit of a sor throat about 2 weeks ago. He thought it was s/t post nasal drip so he did a 5 day course of Zyrtec D without improvement.     This morning he had the fever. He does mention his neck is painful.      Review of Systems   Constitutional:  Positive for fever. Negative for activity change, chills and fatigue.   HENT:  Positive for sore throat. Negative for congestion, ear pain, postnasal drip, rhinorrhea and sinus pressure.    Respiratory:  Negative for cough, shortness of breath and wheezing.    Cardiovascular:  Negative for chest pain and palpitations.   Gastrointestinal:  Negative for constipation, diarrhea, nausea and vomiting.   Genitourinary:  Negative for difficulty urinating, frequency, hematuria and urgency.   Musculoskeletal:  Positive for neck pain. Negative for back pain.   Skin:  Negative for color change.   Neurological:  Negative for  dizziness, syncope and headaches.   Psychiatric/Behavioral:  Negative for confusion.        Allergies   Allergen Reactions    Ondansetron Other (See Comments)     Tongue numbness  Tongue numbness      Past Medical History:   Diagnosis Date    ADHD     Environmental allergies     Severe- sees ENT     Outpatient Medications Marked as Taking for the 05/30/23 encounter (Office Visit) with Trellis Moment, APRN - NP   Medication Sig Dispense Refill    amoxicillin (AMOXIL) 875 MG tablet Take 1 tablet by mouth 2 times daily for 10 days 20 tablet 0     Past Surgical History:   Procedure Laterality Date    HERNIA REPAIR  2018     Family History   Problem Relation Age of Onset    Other Mother         Muir-Torre Syndrome -- genetic predisposition for CRC    Hypertension Father     No Known Problems Sister     No Known Problems Brother     No Known Problems Maternal Grandmother     No Known Problems  Maternal Grandfather     No Known Problems Paternal Grandmother     No Known Problems Paternal Grandfather     No Known Problems Other      Social History     Tobacco Use   Smoking Status Never   Smokeless Tobacco Never   Tobacco Comments    Patient counselled on the dangers of tobacco 05/11/2020, Cessation: Advice given     Social History     Socioeconomic History    Marital status: Single     Spouse name: None    Number of children: None    Years of education: None    Highest education level: None   Tobacco Use    Smoking status: Never    Smokeless tobacco: Never    Tobacco comments:     Patient counselled on the dangers of tobacco 05/11/2020, Cessation: Advice given   Vaping Use    Vaping status: Never Used   Substance and Sexual Activity    Alcohol use: Yes     Comment: once ever 6-34mo    Drug use: Never    Sexual activity: Yes     Partners: Male       BP 110/64   Pulse (!) 110   Temp 100.2 F (37.9 C) (Oral)   Resp 18   SpO2 99%    Physical Exam  Vitals and nursing note reviewed.   Constitutional:       Appearance: Normal appearance.      Comments: Slightly muffled voice   HENT:      Head: Normocephalic and atraumatic.      Right Ear: Tympanic membrane, ear canal and external ear normal.      Left Ear: Tympanic membrane, ear canal and external ear normal.      Nose: Nose normal.      Mouth/Throat:      Mouth: Mucous membranes are moist.      Tonsils: 2+ on the right. 2+ on the left.   Eyes:      Conjunctiva/sclera: Conjunctivae normal.   Cardiovascular:      Rate and Rhythm: Regular rhythm. Tachycardia present.      Pulses: Normal pulses.   Pulmonary:      Effort: Pulmonary effort is normal.  Breath sounds: Normal breath sounds.   Abdominal:      Tenderness: There is abdominal tenderness in the left upper quadrant.   Musculoskeletal:         General: Normal range of motion.      Cervical back: Neck supple.   Lymphadenopathy:      Head:      Right side of head: Tonsillar adenopathy present.      Left side  of head: Tonsillar adenopathy present.   Skin:     General: Skin is warm and dry.      Capillary Refill: Capillary refill takes less than 2 seconds.   Neurological:      General: No focal deficit present.      Mental Status: He is alert.   Psychiatric:         Mood and Affect: Mood normal.         Behavior: Behavior normal.                 (Note to patient: The 21st Century Cures Act requires that medical notes like this be available to patients in the interest of transparency. However, be advised this is a medical document. It is intended as peer to peer communication. It is written in medical language and may contain abbreviations or verbiage that are unfamiliar. It may appear blunt or direct. Medical documents are intended to carry relevant information, facts as evident, and the clinical opinion of the practitioner.)    An electronic signature was used to authenticate this note.    --Trellis Moment, APRN - NP

## 2023-06-07 ENCOUNTER — Encounter

## 2023-06-07 LAB — MISCELLANEOUS SENDOUT

## 2023-06-07 MED ORDER — CEFTRIAXONE SODIUM 500 MG IJ SOLR
500 | Freq: Once | INTRAMUSCULAR | Status: AC
Start: 2023-06-07 — End: 2023-06-07

## 2023-06-07 MED ADMIN — cefTRIAXone (ROCEPHIN) injection 500 mg: 500 mg | INTRAMUSCULAR | @ 21:00:00 | NDC 00409733821

## 2023-06-07 NOTE — Progress Notes (Addendum)
 Spoke with pt and relayed above instructions from provider. Pt verbalized understanding . He is on his way for rocephin injection. Pt came in and received rocephin injection.

## 2023-06-12 ENCOUNTER — Encounter

## 2023-06-12 ENCOUNTER — Inpatient Hospital Stay: Admit: 2023-06-12 | Payer: BLUE CROSS/BLUE SHIELD | Primary: Family Medicine

## 2023-06-12 DIAGNOSIS — S43432D Superior glenoid labrum lesion of left shoulder, subsequent encounter: Secondary | ICD-10-CM

## 2023-06-12 MED ORDER — LIDOCAINE HCL 1 % IJ SOLN
1 | Freq: Once | INTRAMUSCULAR | Status: AC
Start: 2023-06-12 — End: 2023-06-12

## 2023-06-12 MED ORDER — SODIUM BICARBONATE 4.2 % IV SOLN
4.2 | Freq: Once | INTRAVENOUS | Status: AC
Start: 2023-06-12 — End: 2023-06-12

## 2023-06-12 MED ORDER — SODIUM BICARBONATE 4.2 % IV SOLN
4.2 | INTRAVENOUS | Status: AC
Start: 2023-06-12 — End: ?

## 2023-06-12 MED ORDER — IOPAMIDOL 61 % IV SOLN
61 | Freq: Once | INTRAVENOUS | Status: AC | PRN
Start: 2023-06-12 — End: 2023-06-12

## 2023-06-12 MED ORDER — GADOTERIDOL 279.3 MG/ML IV SOLN
279.3 | Freq: Once | INTRAVENOUS | Status: AC | PRN
Start: 2023-06-12 — End: 2023-06-12

## 2023-06-12 MED ORDER — LIDOCAINE HCL 1 % IJ SOLN
1 | INTRAMUSCULAR | Status: AC
Start: 2023-06-12 — End: ?

## 2023-06-12 MED ADMIN — lidocaine 1 % injection 20 mL: 10 mL | SUBCUTANEOUS | @ 15:00:00 | NDC 00409427601

## 2023-06-12 MED ADMIN — sodium bicarbonate 4.2 % injection 5 mEq: 5 meq | SUBCUTANEOUS | @ 15:00:00 | NDC 51754501201

## 2023-06-12 MED ADMIN — iopamidol (ISOVUE-300) 61 % injection 30 mL: 10 mL | INTRA_ARTICULAR | @ 15:00:00 | NDC 00270131525

## 2023-06-12 MED ADMIN — gadoteridol (PROHANCE) injection 1 mL: 1 mL | @ 15:00:00 | NDC 00270111116

## 2023-06-12 MED FILL — LIDOCAINE HCL 1 % IJ SOLN: 1 % | INTRAMUSCULAR | Qty: 20

## 2023-06-12 MED FILL — SODIUM BICARBONATE 4.2 % IV SOLN: 4.2 % | INTRAVENOUS | Qty: 10

## 2023-06-16 MED FILL — SODIUM BICARBONATE 4.2 % IV SOLN: 4.2 % | INTRAVENOUS | Qty: 5

## 2023-06-16 MED FILL — LIDOCAINE HCL 1 % IJ SOLN: 1 % | INTRAMUSCULAR | Qty: 20

## 2023-06-18 NOTE — Telephone Encounter (Signed)
Returned call. Scheduled appt.

## 2023-06-18 NOTE — Telephone Encounter (Signed)
 Patient would like to set up MRI review appointment with Dr. Cheree Ditto before first available in Oct. Please call to assist in scheduling.

## 2023-07-02 ENCOUNTER — Encounter: Payer: BLUE CROSS/BLUE SHIELD | Attending: Sports Medicine | Primary: Family Medicine

## 2023-07-09 ENCOUNTER — Ambulatory Visit
Admit: 2023-07-09 | Discharge: 2023-07-09 | Payer: BLUE CROSS/BLUE SHIELD | Attending: Physician Assistant | Primary: Family Medicine

## 2023-07-09 DIAGNOSIS — R509 Fever, unspecified: Secondary | ICD-10-CM

## 2023-07-09 LAB — POC COVID-19 & INFLUENZA COMBO (LIAT IN HOUSE)
Influenza A: NOT DETECTED
Influenza B: NOT DETECTED
SARS-CoV-2: NOT DETECTED

## 2023-07-09 MED ORDER — AMOXICILLIN-POT CLAVULANATE 875-125 MG PO TABS
875-125 | ORAL_TABLET | Freq: Two times a day (BID) | ORAL | 0 refills | Status: AC
Start: 2023-07-09 — End: 2023-07-16

## 2023-07-09 NOTE — Progress Notes (Signed)
 ASSESSMENT/PLAN:     1. Fever, unspecified fever cause  -     POC COVID-19 & Influenza Combo ([facility]); Future  2. Acute sinusitis, recurrence not specified, unspecified location  -     amoxicillin -clavulanate (AUGMENTIN ) 875-125 MG per tablet; Take 1 tablet by mouth 2 times daily for 7 days, Disp-14 tablet, R-0Normal  3. Elevated blood pressure reading      Results for orders placed or performed in visit on 07/09/23   POC COVID-19 & Influenza Combo ([facility])   Result Value Ref Range    SARS-CoV-2 Not Detected Not Detected    Influenza A Not Detected Not Detected    Influenza B Not Detected Not Detected    Narrative    Pregnant:->No     Treating with antibiotics.  Medication reviewed.  Continue supportive care including rest, hydration, over-the-counter medications as needed.  Discussed signs and symptoms to monitor.  Follow-up if no improvement/worsening.        CHIEF COMPLAINT:  Darius Myers (DOB:  [birth year]) is a 27 y.o. male, here for evaluation of the following chief complaint(s):  Fever and Congestion      HPI:  Darius Myers is a 27 year old male presenting today with a low-grade fever and congestion that started 6 days ago.  He is also having purulent nasal drainage and postnasal drainage.  His temperature was 100.79F last night and he has felt fatigued.  Some ear discomfort.  No sore throat.  No wheezing or shortness of breath.  No cough. Has tried using a Netty pot.      Fever   Associated symptoms include congestion. Pertinent negatives include no chest pain, coughing, headaches or wheezing.       CURRENT MEDICATIONS:  Outpatient Medications Marked as Taking for the 07/09/23 encounter (Office Visit) with Vicci Katheryn LABOR, PA   Medication Sig Dispense Refill    EPINEPHrine (EPIPEN) 0.3 MG/0.3ML SOAJ injection       amoxicillin -clavulanate (AUGMENTIN ) 875-125 MG per tablet Take 1 tablet by mouth 2 times daily for 7 days 14 tablet 0    albuterol  sulfate HFA (VENTOLIN  HFA) 108 (90 Base) MCG/ACT  inhaler Inhale 2 puffs into the lungs 4 times daily as needed for Wheezing 18 g 0    pantoprazole (PROTONIX) 20 MG tablet       Azelastine HCl 137 MCG/SPRAY SOLN       fluticasone (FLONASE) 50 MCG/ACT nasal spray USE 1 SPRAY(S) IN EACH NOSTRIL TWICE DAILY         REVIEW OF SYSTEMS:  Review of Systems   Constitutional:  Positive for fever. Negative for chills.   HENT:  Positive for congestion and rhinorrhea.    Respiratory:  Negative for cough, shortness of breath and wheezing.    Cardiovascular:  Negative for chest pain.   Neurological:  Negative for dizziness and headaches.       ALLERGIES:  Allergies   Allergen Reactions    Ondansetron Other (See Comments)     Tongue numbness  Tongue numbness        PAST MEDICAL HISTORY:  Past Medical History:   Diagnosis Date    ADHD     Environmental allergies     Severe- sees ENT       PAST SURGICAL HISTORY:  Past Surgical History:   Procedure Laterality Date    HERNIA REPAIR  2018       FAMILY HISTORY:  Family History   Problem Relation Age of Onset  Other Mother         Muir-Torre Syndrome -- genetic predisposition for CRC    Hypertension Father     No Known Problems Sister     No Known Problems Brother     No Known Problems Maternal Grandmother     No Known Problems Maternal Grandfather     No Known Problems Paternal Grandmother     No Known Problems Paternal Grandfather     No Known Problems Other        TOBACCO HISTORY:  Social History     Tobacco Use   Smoking Status Never   Smokeless Tobacco Never   Tobacco Comments    Patient counselled on the dangers of tobacco 05/11/2020, Cessation: Advice given       SOCIAL HISTORY:  Social History     Socioeconomic History    Marital status: Single     Spouse name: None    Number of children: None    Years of education: None    Highest education level: None   Tobacco Use    Smoking status: Never    Smokeless tobacco: Never    Tobacco comments:     Patient counselled on the dangers of tobacco 05/11/2020, Cessation: Advice given    Vaping Use    Vaping status: Never Used   Substance and Sexual Activity    Alcohol use: Yes     Comment: once ever 6-18mo    Drug use: Never    Sexual activity: Yes     Partners: Male       OB HISTORY:  OB History   No obstetric history on file.       VITAL SIGNS:  Vitals:    07/09/23 1527 07/09/23 1610   BP: 133/83 100/68   Pulse: 87    Resp: 18    Temp: 97.6 F (36.4 C)    SpO2: 98%    Weight: 77.1 kg (170 lb)    Height: 1.778 m (5' 10)        PHYSICAL EXAM:  Physical Exam  Vitals reviewed.   Constitutional:       General: He is not in acute distress.     Appearance: He is not toxic-appearing.   HENT:      Head: Normocephalic and atraumatic.      Right Ear: Tympanic membrane is not injected or erythematous.      Left Ear: Tympanic membrane is injected (mild). Tympanic membrane is not erythematous.      Mouth/Throat:      Pharynx: No oropharyngeal exudate or posterior oropharyngeal erythema.   Eyes:      Extraocular Movements: Extraocular movements intact.      Conjunctiva/sclera: Conjunctivae normal.   Cardiovascular:      Rate and Rhythm: Normal rate and regular rhythm.      Heart sounds: No murmur heard.     No friction rub. No gallop.   Pulmonary:      Effort: Pulmonary effort is normal. No respiratory distress.      Breath sounds: Normal breath sounds. No wheezing or rhonchi.   Skin:     General: Skin is warm and dry.   Neurological:      General: No focal deficit present.      Mental Status: He is alert and oriented to person, place, and time.   Psychiatric:         Mood and Affect: Mood normal.         Behavior: Behavior  normal.                An electronic signature was used to authenticate this note.    --Katheryn DELENA Louder, PA

## 2023-07-23 ENCOUNTER — Encounter
Admit: 2023-07-23 | Discharge: 2023-07-23 | Payer: BLUE CROSS/BLUE SHIELD | Attending: Sports Medicine | Primary: Family Medicine

## 2023-07-23 DIAGNOSIS — S43432D Superior glenoid labrum lesion of left shoulder, subsequent encounter: Secondary | ICD-10-CM
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
## 2023-08-16 ENCOUNTER — Emergency Department: Admit: 2023-08-17 | Payer: BLUE CROSS/BLUE SHIELD | Primary: Family Medicine

## 2023-08-16 DIAGNOSIS — N50812 Left testicular pain: Secondary | ICD-10-CM

## 2023-08-16 NOTE — ED Provider Notes (Signed)
[facility]  EMERGENCY DEPARTMENT ENCOUNTER      Pt Name: Darius Myers  MRN: [id number]  Birthdate 08/18/1996  Date of evaluation: 08/16/2023  Provider: Gaspar Bidding, MD  Provider evaluation time: 08/16/23 2210    CHIEF COMPLAINT       Chief Complaint   Patient presents with    Testicle Pain    Dizziness     Patient states about an hour ago, he was lying in bed and began having left testicular pain radiating into the abdomen. When attempting to get up, patient had a syncopal episode. Did not hit head. No history of GI or GU problems.        HISTORY OF PRESENT ILLNESS    Patient presents with acute onset of left testicular pain that began while he was laying in bed at approximately 9PM this evening. The pain initially started as minor discomfort but intensified when he attempted to get up. The patient also reports experiencing left lower abdominal pain and a brief episode of near-syncope due to the pain (with no associated chest pain, dyspnea, palpitations, or any other associated symptoms). His spouse witnessed the near syncopal episode and states that it was very brief. He denies any recent trauma/injury.  He denies any dysuria or urinary frequency.    The history is provided by the patient and the spouse.       Nursing Notes were reviewed.    REVIEW OF SYSTEMS     Review of Systems  Constitutional: No fever.  Gastrointestinal: Reports left lower abdominal pain. No nausea/vomiting/diarrhea.  Genitourinary: Reports left testicular pain. No dysuria, hematuria, or urinary frequency.  Neurological: Brief episode of near-syncope due to pain.  Cardiovascular: No chest pain or palpitations.  Respiratory: No shortness of breath.  Patient denies any other symptoms. All are negative, except as noted.    PAST MEDICAL & SURGICAL HISTORY     Past Medical History:   Diagnosis Date    ADHD     Environmental allergies     Severe- sees ENT       Past Surgical History:   Procedure Laterality Date    HERNIA REPAIR  2018        CURRENT MEDICATIONS       Previous Medications    ALBUTEROL SULFATE HFA (VENTOLIN HFA) 108 (90 BASE) MCG/ACT INHALER    Inhale 2 puffs into the lungs 4 times daily as needed for Wheezing    AZELASTINE HCL 137 MCG/SPRAY SOLN        EPINEPHRINE (EPIPEN) 0.3 MG/0.3ML SOAJ INJECTION        FLUTICASONE (FLONASE) 50 MCG/ACT NASAL SPRAY    USE 1 SPRAY(S) IN EACH NOSTRIL TWICE DAILY    PANTOPRAZOLE (PROTONIX) 20 MG TABLET           ALLERGIES     Ondansetron    FAMILY & SOCIAL HISTORY       Family History   Problem Relation Age of Onset    Other Mother         Muir-Torre Syndrome -- genetic predisposition for CRC    Hypertension Father     No Known Problems Sister     No Known Problems Brother     No Known Problems Maternal Grandmother     No Known Problems Maternal Grandfather     No Known Problems Paternal Grandmother     No Known Problems Paternal Grandfather     No Known Problems Other  Social History     Socioeconomic History    Marital status: Single   Tobacco Use    Smoking status: Never    Smokeless tobacco: Never    Tobacco comments:     Patient counselled on the dangers of tobacco 05/11/2020, Cessation: Advice given   Vaping Use    Vaping status: Never Used   Substance and Sexual Activity    Alcohol use: Yes     Comment: once ever 6-64mo    Drug use: Never    Sexual activity: Yes     Partners: Male       PHYSICAL EXAM       ED Triage Vitals [08/16/23 2202]   BP Systolic BP Percentile Diastolic BP Percentile Temp Temp Source Pulse Respirations SpO2   (!) 150/87 -- -- 98.1 F (36.7 C) Oral 97 20 98 %      Height Weight - Scale         1.778 m (5\' 10" ) 79.8 kg (176 lb)             Physical Exam  Triage vitals reviewed.  Constitutional: Alert, in visible discomfort.  HENT: Normocephalic and atraumatic. Airway patent.  Cardiovascular: Normal rate, Normal peripheral perfusion.  Pulmonary: Pulmonary effort is normal. No respiratory distress.  Skin: Skin is warm and dry.  Neurological: Alert and oriented to  person, place, and time, Moving all extremities.  Psychiatric: Normal mood and behavior.  Genitourinary: Bilateral testicular tenderness (left greater than right) with no testicular swelling or scrotal redness/erythema. No inguinal lymphadenopathy, no penile discharge or swelling.  Musculoskeletal: Reports lower back pain.  Abdomen: Mild left lower quadrant abdominal tenderness as well as left flank tenderness. No rebound or guarding. No mass.    Procedures    DIAGNOSTIC RESULTS     RADIOLOGY:   [place] DUP ABD PEL RETRO SCROT LIMITED   Final Result   No testicular torsion.      US SCROTUM AND TESTICLES   Final Result   No testicular torsion.      CT ABDOMEN PELVIS W IV CONTRAST Additional Contrast? None    (Results Pending)      , LABS:  Labs Reviewed   CBC WITH AUTO DIFFERENTIAL - Abnormal; Notable for the following components:       Result Value    Immature Granulocytes % 1.1 (*)     Immature Grans (Abs) 0.11 (*)     All other components within normal limits   BASIC METABOLIC PANEL   URINALYSIS W/ RFLX MICROSCOPIC     All other labs were within normal range or not returned as of this dictation.    ED COURSE, REASSESSMENT and MDM:       ED Course as of 08/17/23 0122   Sun Aug 16, 2023   2220 EKG 12 Lead  EKG Interpretation:  Rhythm:  Sinus  Rate:  79  Axis:  normal  Conduction:  normal  ST-T Wave Morphology:  non-specific  Ectopy:  none  ECG interpreted by me, in the absence of a cardiologist   [DC]   2252 EKG 12 Lead  EKG Interpretation:  Rhythm:  Sinus  Rate:  79  Axis:  normal  Conduction:  normal  ST-T Wave Morphology:  non-specific  Ectopy:  none  ECG interpreted by me, in the absence of a cardiologist   [DC]   Mon Aug 17, 2023   0105 Unremarkable CBC, BMP, and urinalysis [DC]   0118 CT ABDOMEN PELVIS W IV  CONTRAST Additional Contrast? None  Preliminary report of the CT of the abdomen/pelvis, as per the [facility] (Dr. Herma Carson. Janee Morn), notes no acute abnormality.  No obstructive uropathy noted.  No other  acute abnormality noted. [DC]   0118 US SCROTUM AND TESTICLES  Testicular ultrasound shows no evidence of torsion.  No evidence of orchitis or any other testicular abnormality.  The patient was symptomatic with pain throughout the ultrasound.  And subsequently.  I do not suspect intermittent torsion. [DC]      ED Course User Index  [DC] Gaspar Bidding, MD       Medical Decision Making  RE-EVALUATION AT 01:20 AM:  - Unremarkable urinalysis, CBC, and BMP.  - Testicular ultrasound shows no acute abnormality.  - CT scan of the abdomen and pelvis per the preliminary report shows no acute abnormality.  - Patient notes that his testicular pain is now resolved.  - Discussed precautions regarding return to the ED for any new or worsening symptoms.  - Planned for ibuprofen for further symptom relief.  - Advised the patient to follow up with his primary care physician, Dr. Freddy Finner, for any further issues and a recheck in a few days.    Amount and/or Complexity of Data Reviewed  Labs: ordered.  Radiology: ordered.  ECG/medicine tests:  Decision-making details documented in ED Course.    Risk  Prescription drug management.  Risk Details: MDM Summary  1. Number and Complexity of Problems Addressed (CoPA):   High Complexity: The patient presents with acute onset of left testicular pain and left lower abdominal pain, which are undiagnosed new problems with uncertain prognosis. The differential diagnosis includes potentially life-threatening conditions such as testicular torsion and kidney stone, requiring immediate evaluation and intervention.    2. Amount and/or Complexity of Data to be Reviewed and Analyzed (Data):   Extensive: Review of multiple diagnostic tests including CBC, BMP, UA, testicular ultrasound, and CT scan. Independent interpretation of testicular ultrasound and CT scan was performed. Discussion with the patient's spouse regarding the near syncopal episode was conducted.    3. Risk of Complications, Morbidity,  and/or Mortality (Risk):   High Risk: The patient is at high risk due to the potential diagnoses of testicular torsion and kidney stone, which require immediate evaluation and intervention. Management included non-narcotic pain management and planning for ibuprofen for further symptom relief. The patient was discharged home with instructions to follow up with his primary care physician.          FINAL IMPRESSION      1. Testicular pain, left          DISPOSITION/PLAN   DISPOSITION Decision To Discharge 08/17/2023 01:21:15 AM           PATIENT REFERRED TO:  Laurence Ferrari, MD  [address]  [place] [address]  [phone]      Follow-up in the next few days, To recheck your symptoms    [facility]  [address]  [place] [address]  [phone]    As needed, If symptoms worsen      DISCHARGE MEDICATIONS:  New Prescriptions    IBUPROFEN (ADVIL;MOTRIN) 800 MG TABLET    Take 1 tablet by mouth every 8 hours as needed for Pain       (Portions of this note were completed with the assistance of an AI transcription program.  Verbal consent was obtained from the patient and/or guardian prior to use of the application.)  Gaspar Bidding, MD (electronically signed)  Emergency Medicine        Gaspar Bidding, MD  08/17/23 [phone]

## 2023-08-17 ENCOUNTER — Emergency Department: Admit: 2023-08-17 | Payer: BLUE CROSS/BLUE SHIELD | Primary: Family Medicine

## 2023-08-17 ENCOUNTER — Inpatient Hospital Stay
Admit: 2023-08-17 | Discharge: 2023-08-17 | Disposition: A | Discharge status: Home / Self Care | Payer: BLUE CROSS/BLUE SHIELD | Attending: Emergency Medicine

## 2023-08-17 LAB — CBC WITH AUTO DIFFERENTIAL
Basophils %: 0.4 % (ref 0.0–2.0)
Basophils Absolute: 0 10*3/uL (ref 0.0–0.2)
Eosinophils %: 3.7 % (ref 0.0–7.0)
Eosinophils Absolute: 0.4 10*3/uL (ref 0.0–0.5)
Hematocrit: 41.9 % (ref 38.0–52.0)
Hemoglobin: 14.7 g/dL (ref 13.0–17.3)
Immature Grans (Abs): 0.11 10*3/uL — ABNORMAL HIGH (ref 0.00–0.06)
Immature Granulocytes %: 1.1 % — ABNORMAL HIGH (ref 0.0–0.6)
Lymphocytes Absolute: 2.2 10*3/uL (ref 1.0–3.2)
Lymphocytes: 23.1 % (ref 15.0–45.0)
MCH: 31.3 pg (ref 27.0–34.5)
MCHC: 35.1 g/dL (ref 30.0–36.0)
MCV: 89.1 fL (ref 84.0–100.0)
MPV: 10.3 fL (ref 7.0–12.2)
Monocytes %: 6.2 % (ref 4.0–12.0)
Monocytes Absolute: 0.6 10*3/uL (ref 0.3–1.0)
Neutrophils %: 65.5 % (ref 42.0–74.0)
Neutrophils Absolute: 6.3 10*3/uL (ref 1.6–7.3)
Platelets: 259 10*3/uL (ref 140–440)
RBC: 4.7 x10e6/mcL (ref 4.00–5.60)
RDW: 12.3 % (ref 10.0–17.0)
WBC: 9.7 10*3/uL (ref 3.8–10.6)

## 2023-08-17 LAB — BASIC METABOLIC PANEL
Anion Gap: 11 mmol/L (ref 2–17)
BUN: 12 mg/dL (ref 6–20)
CO2: 24 mmol/L (ref 22–29)
Calcium: 8.9 mg/dL (ref 8.5–10.7)
Chloride: 104 mmol/L (ref 98–107)
Creatinine: 0.8 mg/dL (ref 0.7–1.3)
Est, Glom Filt Rate: 125 mL/min/1.73mÂ² (ref >=60)
Glucose: 98 mg/dL (ref 70–99)
Osmolaliy Calculated: 277 mosm/kg (ref 270–287)
Potassium: 3.8 mmol/L (ref 3.5–5.3)
Sodium: 139 mmol/L (ref 135–145)

## 2023-08-17 LAB — EKG 12-LEAD
P Axis: 40 degrees
P-R Interval: 138 ms
Q-T Interval: 347 ms
QRS Duration: 89 ms
QTc Calculation (Bazett): 398 ms
R Axis: 84 degrees
T Axis: 32 degrees
Ventricular Rate: 79 {beats}/min

## 2023-08-17 LAB — URINALYSIS W/ RFLX MICROSCOPIC
Bilirubin, Urine: NEGATIVE
Blood, Urine: NEGATIVE
Glucose, Ur: NEGATIVE
Ketones, Urine: NEGATIVE
Leukocyte Esterase, Urine: NEGATIVE
Nitrite, Urine: NEGATIVE
Protein, UA: NEGATIVE
Specific Gravity, UA: 1.025 (ref 1.003–1.035)
Urobilinogen, Urine: 0.2 U/dL (ref >1)
pH, Urine: 6 (ref 4.5–8.0)

## 2023-08-17 MED ORDER — IOPAMIDOL 61 % IV SOLN
61 | Freq: Once | INTRAVENOUS | Status: AC | PRN
Start: 2023-08-17 — End: 2023-08-17
  Administered 2023-08-17: 06:00:00 100 mL via INTRAVENOUS

## 2023-08-17 MED ORDER — IBUPROFEN 800 MG PO TABS
800 MG | ORAL_TABLET | Freq: Three times a day (TID) | ORAL | 0 refills | Status: AC | PRN
Start: 2023-08-17 — End: 2023-10-21

## 2023-08-17 MED ORDER — KETOROLAC TROMETHAMINE 15 MG/ML IJ SOLN
15 | Freq: Once | INTRAMUSCULAR | Status: AC
Start: 2023-08-17 — End: 2023-08-16
  Administered 2023-08-17: 04:00:00 15 mg via INTRAVENOUS

## 2023-08-17 MED FILL — KETOROLAC TROMETHAMINE 15 MG/ML IJ SOLN: 15 MG/ML | INTRAMUSCULAR | Qty: 1

## 2023-08-17 NOTE — Discharge Instructions (Signed)
Thank you for visiting the Emergency Department today.  Your visit today is just one step in your care.  It is important to follow-up with your doctor for ongoing care.    You may also take Tylenol (according to package instructions) for pain relief.    Return to the Emergency Department, as needed, for any new/worsening symptoms or other concerns.     If you do not have a primary care physician or need help finding any other type of physician, you may call [phone] for assistance.

## 2023-08-18 ENCOUNTER — Ambulatory Visit: Admit: 2023-08-18 | Discharge: 2023-08-18 | Payer: BLUE CROSS/BLUE SHIELD | Attending: Family | Primary: Family Medicine

## 2023-08-18 ENCOUNTER — Encounter

## 2023-08-18 DIAGNOSIS — J029 Acute pharyngitis, unspecified: Secondary | ICD-10-CM

## 2023-08-18 LAB — AMB POC INFLUENZA ASSAY W/OPTIC
Flu A Antigen: NEGATIVE
Flu B Antigen: NEGATIVE

## 2023-08-18 MED ORDER — PROMETHAZINE HCL 25 MG PO TABS
25 | ORAL_TABLET | Freq: Four times a day (QID) | ORAL | 0 refills | Status: AC | PRN
Start: 2023-08-18 — End: 2023-08-26

## 2023-08-18 NOTE — Progress Notes (Signed)
 Darius Myers (DOB:  21-Nov-1995) is a 27 y.o. male,Established patient, here for evaluation of the following chief complaint(s):  Sexually Transmitted Diseases (3 month he was positive for oral gonorrhea and want to be test for the flu.)      History of Prese

## 2023-08-20 ENCOUNTER — Encounter

## 2023-08-20 MED ORDER — CEFTRIAXONE SODIUM 500 MG IJ SOLR
500 | Freq: Once | INTRAMUSCULAR | Status: AC
Start: 2023-08-20 — End: 2023-08-20
  Administered 2023-08-20: 22:00:00 500 mg via INTRAMUSCULAR

## 2023-08-20 MED ORDER — DOXYCYCLINE HYCLATE 100 MG PO TABS
100 | ORAL_TABLET | Freq: Two times a day (BID) | ORAL | 0 refills | Status: AC
Start: 2023-08-20 — End: 2023-08-27
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
## 2023-08-27 LAB — MISCELLANEOUS SENDOUT: test code: 188698

## 2023-08-27 NOTE — Telephone Encounter (Signed)
 Pharyngeal testing negative for chlamydia and gonnorhea.

## 2023-08-28 NOTE — Telephone Encounter (Signed)
 Tried calling pt no answer will attempt to call again later

## 2023-08-29 NOTE — Telephone Encounter (Signed)
 Called and informed pt that his labs was negative.

## 2023-09-12 ENCOUNTER — Encounter: Primary: Family Medicine

## 2023-10-04 LAB — AMB POC RAPID STREP A: Group A Strep Antigen, POC: POSITIVE

## 2023-10-04 MED ORDER — AMOXICILLIN-POT CLAVULANATE 875-125 MG PO TABS
875-125 | ORAL_TABLET | Freq: Two times a day (BID) | ORAL | 0 refills | Status: AC
Start: 2023-10-04 — End: 2023-10-14

## 2023-10-04 NOTE — Progress Notes (Signed)
 ASSESSMENT/PLAN:     1. Sore throat  -     AMB POC RAPID STREP A  2. Strep pharyngitis  -     amoxicillin-clavulanate (AUGMENTIN) 875-125 MG per tablet; Take 1 tablet by mouth 2 times daily for 10 days, Disp-20 tablet, R-0Normal      Results for orders

## 2023-10-15 ENCOUNTER — Ambulatory Visit
Admit: 2023-10-15 | Discharge: 2023-10-15 | Payer: BLUE CROSS/BLUE SHIELD | Attending: Sports Medicine | Primary: Family Medicine

## 2023-10-15 VITALS — Ht 70.0 in | Wt 176.5 lb

## 2023-10-15 DIAGNOSIS — S43432D Superior glenoid labrum lesion of left shoulder, subsequent encounter: Secondary | ICD-10-CM

## 2023-10-15 NOTE — Progress Notes (Signed)
Chief Complaint   Patient presents with    Follow-up     Left Shoulder        HPI  Darius Myers (DOB:  [birth year]) is a 28 y.o. male, seen today for evaluation of Follow-up (Left Shoulder)  .     History of Present Illness  The patient presents for evaluation of his left shoulder.  Seems to be improved overall responding well to home exercise program.  Dull aching pain with weather changes.           Allergies   Allergen Reactions    Ondansetron Other (See Comments)     Tongue numbness  Tongue numbness      Current Outpatient Medications   Medication Sig Dispense Refill    ibuprofen (ADVIL;MOTRIN) 800 MG tablet Take 1 tablet by mouth every 8 hours as needed for Pain 30 tablet 0    EPINEPHrine (EPIPEN) 0.3 MG/0.3ML SOAJ injection       albuterol sulfate HFA (VENTOLIN HFA) 108 (90 Base) MCG/ACT inhaler Inhale 2 puffs into the lungs 4 times daily as needed for Wheezing 18 g 0    pantoprazole (PROTONIX) 20 MG tablet       Azelastine HCl 137 MCG/SPRAY SOLN       fluticasone (FLONASE) 50 MCG/ACT nasal spray USE 1 SPRAY(S) IN EACH NOSTRIL TWICE DAILY       No current facility-administered medications for this visit.      Past Medical History:   Diagnosis Date    ADHD     Environmental allergies     Severe- sees ENT      Past Surgical History:   Procedure Laterality Date    HERNIA REPAIR  2018      Family History   Problem Relation Age of Onset    Other Mother         Muir-Torre Syndrome -- genetic predisposition for CRC    Hypertension Father     No Known Problems Sister     No Known Problems Brother     No Known Problems Maternal Grandmother     No Known Problems Maternal Grandfather     No Known Problems Paternal Grandmother     No Known Problems Paternal Grandfather     No Known Problems Other       Social History     Occupational History    Not on file   Tobacco Use    Smoking status: Never    Smokeless tobacco: Never    Tobacco comments:     Patient counselled on the dangers of tobacco 05/11/2020, Cessation: Advice  given   Vaping Use    Vaping status: Never Used   Substance and Sexual Activity    Alcohol use: Yes     Comment: once ever 6-33mo    Drug use: Never    Sexual activity: Yes     Partners: Male        Review of Systems:  Constitutional:  Negative for activity change.   HENT:  Negative for congestion and trouble swallowing.    Respiratory:  Negative for shortness of breath.  negative Obstructive sleep apnea.  Cardiovascular:  Negative for chest pain. negative Blood thinners. positive hypertension.   Gastrointestinal:  Negative for abdominal pain.   Endocrine: Negative for heat or cold intolerance. positive Diabetes mellitus.   Skin:  Negative for rash.  Allergic/Immunologic: Negative for immunocompromised state.   Neurological:  Negative for dizziness.   Psychiatric/Behavioral:  Negative for behavioral problems.  Objective   Ortho Exam   Right shoulder:  Inspection: no effusion, no deformity, no scapular winging, no muscle asymmetry, no atrophy.  No tenderness to palpation.  ROM: Full passive and active ROM, painless ROM  Strength: 5/5.  Tests: negative empty can, negative impingement, negative speed, negative liftoff, negative Neer, negative Obrien's. No motor or sensory deficits.  Cervical: No spinous process tenderness or muscular tenderness. Full ROM.      Left shoulder:  Inspection: No effusion, no deformity, no scapular winging, no muscle asymmetry, no atrophy.  No tenderness to palpation.  ROM: Full passive and active ROM, painless ROM  Strength: 5/5.  Tests: Negative empty can, negative impingement, negative speed, negative liftoff, negative Neer, negative Obrien's. No motor or sensory deficits.  Cervical: No spinous process tenderness or muscular tenderness. Full ROM.                Assessment & Plan   ASSESSMENT/PLAN:      ICD-10-CM    1. Tear of left glenoid labrum, subsequent encounter  S43.432D       2. Subluxation of left shoulder girdle, subsequent encounter  S43.302D          1. Tear of left  glenoid labrum, subsequent encounter  2. Subluxation of left shoulder girdle, subsequent encounter      Assessment & Plan    His condition has shown significant improvement since the last visit in October 2024. He is advised to abstain from push-ups for an additional month until the cold weather subsides and the associated pain diminishes. Subsequently, he may gradually reintroduce push-ups into his exercise regimen, initially limiting the range of motion to 90 degrees for a few weeks before progressively increasing it.  Follow-up as needed for any further concerns.           An electronic signature was used to authenticate this note.    --August Albino, MD     The patient (or guardian, if applicable) and other individuals in attendance with the patient were advised that Artificial Intelligence will be utilized during this visit to record and process the conversation to generate a clinical note. The patient (or guardian, if applicable) and other individuals in attendance at the appointment consented to the use of AI, including the recording.

## 2023-10-21 ENCOUNTER — Ambulatory Visit
Admit: 2023-10-21 | Discharge: 2023-10-21 | Payer: BLUE CROSS/BLUE SHIELD | Attending: Physician Assistant | Primary: Family Medicine

## 2023-10-21 VITALS — BP 119/74 | HR 95 | Temp 98.20000°F | Resp 18 | Ht 70.0 in | Wt 175.0 lb

## 2023-10-21 DIAGNOSIS — J029 Acute pharyngitis, unspecified: Secondary | ICD-10-CM

## 2023-10-21 LAB — AMB POC RAPID STREP A: Group A Strep Antigen, POC: NEGATIVE

## 2023-10-21 MED ORDER — DEXAMETHASONE SODIUM PHOSPHATE 4 MG/ML IJ SOLN
4 | Freq: Once | INTRAMUSCULAR | Status: AC
Start: 2023-10-21 — End: 2023-10-21
  Administered 2023-10-21: 16:00:00 4 mg via INTRAMUSCULAR

## 2023-10-21 MED ORDER — METHYLPREDNISOLONE ACETATE 40 MG/ML IJ SUSP
40 | Freq: Once | INTRAMUSCULAR | Status: AC
Start: 2023-10-21 — End: 2023-10-21
  Administered 2023-10-21: 16:00:00 40 mg via INTRAMUSCULAR

## 2023-10-21 MED ORDER — PREDNISONE 20 MG PO TABS
20 | ORAL_TABLET | Freq: Every day | ORAL | 0 refills | Status: AC
Start: 2023-10-21 — End: 2023-10-26

## 2023-10-21 MED ORDER — ALBUTEROL SULFATE HFA 108 (90 BASE) MCG/ACT IN AERS
10890 (90 Base) MCG/ACT | Freq: Four times a day (QID) | RESPIRATORY_TRACT | 0 refills | Status: DC | PRN
Start: 2023-10-21 — End: 2023-11-17

## 2023-10-21 MED ORDER — PSEUDOEPH-BROMPHEN-DM 30-2-10 MG/5ML PO SYRP
2-30-10 | Freq: Four times a day (QID) | ORAL | 0 refills | Status: AC | PRN
Start: 2023-10-21 — End: 2023-10-31

## 2023-10-21 NOTE — Progress Notes (Signed)
ASSESSMENT/PLAN:     1. Sore throat  -     POC Strep A Assay w/Optic (81191)  -     Culture, Throat  2. Acute cough  -     dexAMETHasone (DECADRON) injection 4 mg; 4 mg, IntraMUSCular, ONCE, 1 dose, On Wed 10/21/23 at 1145  -     methylPREDNISolone acetate (DEPO-MEDROL) injection 40 mg; 40 mg, IntraMUSCular, ONCE, 1 dose, On Wed 10/21/23 at 1145  -     predniSONE (DELTASONE) 20 MG tablet; Take 1 tablet by mouth daily for 5 days, Disp-5 tablet, R-0Normal  -     albuterol sulfate HFA (VENTOLIN HFA) 108 (90 Base) MCG/ACT inhaler; Inhale 2 puffs into the lungs 4 times daily as needed for Wheezing, Disp-18 g, R-0Normal  -     brompheniramine-pseudoephedrine-DM 2-30-10 MG/5ML syrup; Take 5 mLs by mouth 4 times daily as needed for Congestion or Cough, Disp-200 mL, R-0Normal  3. Dyspnea, unspecified type  -     dexAMETHasone (DECADRON) injection 4 mg; 4 mg, IntraMUSCular, ONCE, 1 dose, On Wed 10/21/23 at 1145  -     methylPREDNISolone acetate (DEPO-MEDROL) injection 40 mg; 40 mg, IntraMUSCular, ONCE, 1 dose, On Wed 10/21/23 at 1145  -     predniSONE (DELTASONE) 20 MG tablet; Take 1 tablet by mouth daily for 5 days, Disp-5 tablet, R-0Normal  -     albuterol sulfate HFA (VENTOLIN HFA) 108 (90 Base) MCG/ACT inhaler; Inhale 2 puffs into the lungs 4 times daily as needed for Wheezing, Disp-18 g, R-0Normal  4. Viral syndrome      Results for orders placed or performed in visit on 10/21/23   POC Strep A Assay w/Optic (47829)   Result Value Ref Range    Valid Internal Control, POC pass     Group A Strep Antigen, POC Negative      Rapid strep neg, sending throat culture out for confirmation.  Home Covid/flu testing neg.  Most likely viral etiology.  Treating with a steroid shot in clinic today followed by a course of steroids and cough medicine as needed.  Also, sent in albuterol inhaler to use as needed.  Medications reviewed.  Continue Tylenol and/or Motrin for fever/aches.  Recommend supportive care, including rest, hydration,  and over-the-counter medications as needed.  Discussed signs and symptoms to monitor.  Follow-up if no improvement/worsening symptoms.           CHIEF COMPLAINT:  Darius Myers (DOB:  1995/12/13) is a 28 y.o. male, here for evaluation of the following chief complaint(s):  Cough, Congestion (Runny nose ), Shortness of Breath, Ear Pain (Rt ear pain ), Pharyngitis, and Other (Cold sx 2 days, motrin, home covid/flu negative )      HPI:  Darius Myers is a 28 year old male presenting today with a cough, congestion, ear pain, sore throat, and a runny nose that started yesterday.  He has had some shortness of breath.  He has had some bodyaches.  Feels feverish.  Multiple sick contacts through work.  He took a home COVID and flu test yesterday and today.  Both tests were negative.  He has taken over-the-counter medications.  He ran out of his inhaler.          CURRENT MEDICATIONS:  Outpatient Medications Marked as Taking for the 10/21/23 encounter (Office Visit) with Darius Nest, PA   Medication Sig Dispense Refill    predniSONE (DELTASONE) 20 MG tablet Take 1 tablet by mouth daily for 5 days 5 tablet 0  albuterol sulfate HFA (VENTOLIN HFA) 108 (90 Base) MCG/ACT inhaler Inhale 2 puffs into the lungs 4 times daily as needed for Wheezing 18 g 0    brompheniramine-pseudoephedrine-DM 2-30-10 MG/5ML syrup Take 5 mLs by mouth 4 times daily as needed for Congestion or Cough 200 mL 0    EPINEPHrine (EPIPEN) 0.3 MG/0.3ML SOAJ injection       albuterol sulfate HFA (VENTOLIN HFA) 108 (90 Base) MCG/ACT inhaler Inhale 2 puffs into the lungs 4 times daily as needed for Wheezing 18 g 0    pantoprazole (PROTONIX) 20 MG tablet       Azelastine HCl 137 MCG/SPRAY SOLN       fluticasone (FLONASE) 50 MCG/ACT nasal spray USE 1 SPRAY(S) IN EACH NOSTRIL TWICE DAILY         REVIEW OF SYSTEMS:  Review of Systems   Constitutional:  Positive for fever.   HENT:  Positive for congestion, ear pain, rhinorrhea and sore throat.    Respiratory:  Positive for  cough and shortness of breath.    Cardiovascular:  Negative for chest pain.   Neurological:  Positive for headaches.       ALLERGIES:  Allergies   Allergen Reactions    Ondansetron Other (See Comments)     Tongue numbness  Tongue numbness        PAST MEDICAL HISTORY:  Past Medical History:   Diagnosis Date    ADHD     Environmental allergies     Severe- sees ENT       PAST SURGICAL HISTORY:  Past Surgical History:   Procedure Laterality Date    HERNIA REPAIR  2018       FAMILY HISTORY:  Family History   Problem Relation Age of Onset    Other Mother         Muir-Torre Syndrome -- genetic predisposition for CRC    Hypertension Father     No Known Problems Sister     No Known Problems Brother     No Known Problems Maternal Grandmother     No Known Problems Maternal Grandfather     No Known Problems Paternal Grandmother     No Known Problems Paternal Grandfather     No Known Problems Other        TOBACCO HISTORY:  Social History     Tobacco Use   Smoking Status Never    Passive exposure: Never   Smokeless Tobacco Never   Tobacco Comments    Patient counselled on the dangers of tobacco 05/11/2020, Cessation: Advice given       SOCIAL HISTORY:  Social History     Socioeconomic History    Marital status: Single     Spouse name: None    Number of children: None    Years of education: None    Highest education level: None   Tobacco Use    Smoking status: Never     Passive exposure: Never    Smokeless tobacco: Never    Tobacco comments:     Patient counselled on the dangers of tobacco 05/11/2020, Cessation: Advice given   Vaping Use    Vaping status: Never Used   Substance and Sexual Activity    Alcohol use: Yes     Comment: once ever 6-53mo    Drug use: Never    Sexual activity: Yes     Partners: Male       OB HISTORY:  OB History   No obstetric history on file.  VITAL SIGNS:  Vitals:    10/21/23 1053   BP: 119/74   Pulse: 95   Resp: 18   Temp: 98.2 F (36.8 C)   SpO2: 96%   Weight: 79.4 kg (175 lb)   Height: 1.778 m (5'  10")       PHYSICAL EXAM:  Physical Exam  Vitals reviewed.   Constitutional:       General: He is not in acute distress.     Appearance: He is not toxic-appearing.   HENT:      Head: Normocephalic and atraumatic.      Right Ear: Tympanic membrane normal.      Left Ear: Tympanic membrane normal.      Mouth/Throat:      Pharynx: No oropharyngeal exudate or posterior oropharyngeal erythema.   Eyes:      Extraocular Movements: Extraocular movements intact.      Conjunctiva/sclera: Conjunctivae normal.   Cardiovascular:      Rate and Rhythm: Normal rate and regular rhythm.      Heart sounds: No murmur heard.     No friction rub. No gallop.   Pulmonary:      Effort: Pulmonary effort is normal. No respiratory distress.      Breath sounds: Normal breath sounds. No wheezing or rhonchi.   Skin:     General: Skin is warm and dry.   Neurological:      General: No focal deficit present.      Mental Status: He is alert and oriented to person, place, and time.   Psychiatric:         Mood and Affect: Mood normal.         Behavior: Behavior normal.                An electronic signature was used to authenticate this note.    --Darius Nest, PA

## 2023-10-23 LAB — CULTURE, THROAT: FINAL REPORT: NORMAL

## 2023-10-24 NOTE — Other (Signed)
Please advise patient/parent that the throat culture came back negative for strep.  No antibiotic needed.  Follow up with PCP or pediatrician if still having symptoms.

## 2023-10-26 NOTE — Progress Notes (Signed)
 Spoke with pt and relayed above instructions from provider. Pt verbalized understanding .

## 2023-10-28 ENCOUNTER — Encounter: Attending: Family | Primary: Family Medicine

## 2023-11-10 ENCOUNTER — Ambulatory Visit
Admit: 2023-11-10 | Discharge: 2023-11-10 | Payer: BLUE CROSS/BLUE SHIELD | Attending: Physician Assistant | Primary: Family Medicine

## 2023-11-10 VITALS — BP 114/78 | HR 91 | Temp 97.80000°F | Resp 20 | Ht 70.0 in | Wt 174.0 lb

## 2023-11-10 DIAGNOSIS — J019 Acute sinusitis, unspecified: Secondary | ICD-10-CM

## 2023-11-10 MED ORDER — DEXAMETHASONE SODIUM PHOSPHATE 10 MG/ML IJ SOLN
10 | Freq: Once | INTRAMUSCULAR | Status: AC
Start: 2023-11-10 — End: 2023-11-10
  Administered 2023-11-10: 16:00:00 10 mg via INTRAMUSCULAR

## 2023-11-10 MED ORDER — AMOXICILLIN-POT CLAVULANATE 875-125 MG PO TABS
875-125 | ORAL_TABLET | Freq: Two times a day (BID) | ORAL | 0 refills | Status: AC
Start: 2023-11-10 — End: 2023-11-20

## 2023-11-10 NOTE — Progress Notes (Signed)
ASSESSMENT/PLAN:     1. Acute sinusitis, recurrence not specified, unspecified location  -     dexAMETHasone (DECADRON) injection 10 mg; 10 mg, IntraMUSCular, ONCE, 1 dose, On Tue 11/10/23 at 1130  -     amoxicillin-clavulanate (AUGMENTIN) 875-125 MG per tablet; Take 1 tablet by mouth 2 times daily for 10 days, Disp-20 tablet, R-0Normal    Treating with a steroid injection today as well as a course of antibiotics.  Medications reviewed.  Continue supportive care including rest, hydration, over-the-counter medications as needed.  Discussed signs and symptoms to monitor.  Follow-up if no improvement/worsening.      CHIEF COMPLAINT:  Darius Myers (DOB:  [birth year]) is a 28 y.o. male, here for evaluation of the following chief complaint(s):  Sinusitis (Sinus pressure, ear pressure, and he still has cough.)      HPI:  Darius Myers is a 27 year old male presenting today for follow-up.  He was seen a couple weeks ago with a cough, congestion, and sore throat.  He was diagnosed with a viral syndrome at this time.  He started to feel better for couple days but has since developed increased sinus congestion, sinus pressure/pain, headache, and cough.  He is now having a productive cough with purulent nasal drainage.  Occasional chest tightness but no wheezing or difficulty breathing.  He has had chills but no fevers.  No difficulty swallowing.  Has taken Sudafed and other over-the-counter medications.      Sinusitis  Associated symptoms include chills, congestion, coughing, headaches and sinus pressure. Pertinent negatives include no shortness of breath.       CURRENT MEDICATIONS:  Outpatient Medications Marked as Taking for the 11/10/23 encounter (Office Visit) with Jason Nest, PA   Medication Sig Dispense Refill    amoxicillin-clavulanate (AUGMENTIN) 875-125 MG per tablet Take 1 tablet by mouth 2 times daily for 10 days 20 tablet 0    EPINEPHrine (EPIPEN) 0.3 MG/0.3ML SOAJ injection       albuterol sulfate HFA (VENTOLIN  HFA) 108 (90 Base) MCG/ACT inhaler Inhale 2 puffs into the lungs 4 times daily as needed for Wheezing 18 g 0    pantoprazole (PROTONIX) 20 MG tablet       Azelastine HCl 137 MCG/SPRAY SOLN       fluticasone (FLONASE) 50 MCG/ACT nasal spray USE 1 SPRAY(S) IN EACH NOSTRIL TWICE DAILY         REVIEW OF SYSTEMS:  Review of Systems   Constitutional:  Positive for chills. Negative for fever.   HENT:  Positive for congestion, sinus pressure and sinus pain.    Respiratory:  Positive for cough. Negative for shortness of breath.    Cardiovascular:  Negative for chest pain.   Neurological:  Positive for headaches. Negative for dizziness.       ALLERGIES:  Allergies   Allergen Reactions    Ondansetron Other (See Comments)     Tongue numbness  Tongue numbness        PAST MEDICAL HISTORY:  Past Medical History:   Diagnosis Date    ADHD     COVID-19     Environmental allergies     Severe- sees ENT    Hypokalemia     PTSD (post-traumatic stress disorder)        PAST SURGICAL HISTORY:  Past Surgical History:   Procedure Laterality Date    HERNIA REPAIR  2018       FAMILY HISTORY:  Family History   Problem Relation Age of Onset  Other Mother         Muir-Torre Syndrome -- genetic predisposition for CRC    Hypertension Father     No Known Problems Sister     No Known Problems Brother     No Known Problems Maternal Grandmother     No Known Problems Maternal Grandfather     No Known Problems Paternal Grandmother     No Known Problems Paternal Grandfather     No Known Problems Other        TOBACCO HISTORY:  Social History     Tobacco Use   Smoking Status Never    Passive exposure: Never   Smokeless Tobacco Never   Tobacco Comments    Patient counselled on the dangers of tobacco 05/11/2020, Cessation: Advice given       SOCIAL HISTORY:  Social History     Socioeconomic History    Marital status: Single     Spouse name: None    Number of children: None    Years of education: None    Highest education level: None   Tobacco Use    Smoking  status: Never     Passive exposure: Never    Smokeless tobacco: Never    Tobacco comments:     Patient counselled on the dangers of tobacco 05/11/2020, Cessation: Advice given   Vaping Use    Vaping status: Never Used   Substance and Sexual Activity    Alcohol use: Yes     Comment: once ever 6-65mo    Drug use: Never    Sexual activity: Yes     Partners: Male       OB HISTORY:  OB History   No obstetric history on file.       VITAL SIGNS:  Vitals:    11/10/23 1048   BP: 114/78   Pulse: 91   Resp: 20   Temp: 97.8 F (36.6 C)   TempSrc: Oral   SpO2: 100%   Weight: 78.9 kg (174 lb)   Height: 1.778 m (5\' 10" )       PHYSICAL EXAM:  Physical Exam  Vitals reviewed.   Constitutional:       General: He is not in acute distress.     Appearance: He is not toxic-appearing.   HENT:      Head: Normocephalic and atraumatic.      Comments: B/l sinus tenderness     Right Ear: Tympanic membrane normal.      Left Ear: Tympanic membrane normal.      Mouth/Throat:      Pharynx: No oropharyngeal exudate or posterior oropharyngeal erythema.   Eyes:      Extraocular Movements: Extraocular movements intact.      Conjunctiva/sclera: Conjunctivae normal.   Cardiovascular:      Rate and Rhythm: Normal rate and regular rhythm.      Heart sounds: No murmur heard.     No friction rub. No gallop.   Pulmonary:      Effort: Pulmonary effort is normal. No respiratory distress.      Breath sounds: Normal breath sounds. No wheezing or rhonchi.   Skin:     General: Skin is warm and dry.   Neurological:      General: No focal deficit present.      Mental Status: He is alert and oriented to person, place, and time.   Psychiatric:         Mood and Affect: Mood normal.  Behavior: Behavior normal.                An electronic signature was used to authenticate this note.    --Jason Nest, PA

## 2023-11-17 ENCOUNTER — Ambulatory Visit: Admit: 2023-11-17 | Discharge: 2023-11-17 | Payer: BLUE CROSS/BLUE SHIELD | Attending: Family | Primary: Family Medicine

## 2023-11-17 VITALS — BP 110/68 | HR 77 | Wt 176.5 lb

## 2023-11-17 DIAGNOSIS — R051 Acute cough: Secondary | ICD-10-CM

## 2023-11-17 MED ORDER — SPACER/AERO-HOLDING CHAMBERS DEVI
0 refills | Status: AC | PRN
Start: 2023-11-17 — End: ?

## 2023-11-17 NOTE — Progress Notes (Signed)
CHIEF COMPLAINT:  Chief Complaint   Patient presents with    Other     Pt is following up [facility]. Pt was dx with viral infection. Pt went back again last week and was put on abx. Pt is still dealing with cough still.     Ok with AI           HISTORY OF PRESENT ILLNESS:  Darius Myers is a 28 y.o. male who reports cough x 3 weeks. Denies f/c/s, myalgias, ST, CP, SOB, abd pain, n/v, bowel change. Denies smoking. Denies hx lung disease. Sick contacts: none. Prior covid infection: none.    [facility] diagnosed with acute pharyngitis, viral syndrome given IM steroids and prescription for prednisone burst.  Reports symptoms fail to improve returned 11/10/23 and treated for sinusitis with amoxicillin and given 10mg  of Dexamethasone IM. States he is on day 4 or 5 of abx and overall slightly improving. Reports sob improved with 2-3 days of augmentin. Denies f/c/s since initial onset of sx. Denies vomiting.     PHQ:      11/25/2022     9:19 AM   PHQ-9    Little interest or pleasure in doing things 0   Feeling down, depressed, or hopeless 0   PHQ-2 Score 0   PHQ-9 Total Score 0       CURRENT MEDICATION LIST:    Outpatient Encounter Medications as of 11/17/2023   Medication Sig Dispense Refill    Spacer/Aero-Holding Chambers DEVI 1 Device by Does not apply route as needed (SOB, chest tightness or wheezing) 1 each 0    amoxicillin-clavulanate (AUGMENTIN) 875-125 MG per tablet Take 1 tablet by mouth 2 times daily for 10 days 20 tablet 0    EPINEPHrine (EPIPEN) 0.3 MG/0.3ML SOAJ injection       albuterol sulfate HFA (VENTOLIN HFA) 108 (90 Base) MCG/ACT inhaler Inhale 2 puffs into the lungs 4 times daily as needed for Wheezing 18 g 0    pantoprazole (PROTONIX) 20 MG tablet       Azelastine HCl 137 MCG/SPRAY SOLN       fluticasone (FLONASE) 50 MCG/ACT nasal spray USE 1 SPRAY(S) IN EACH NOSTRIL TWICE DAILY      [DISCONTINUED] albuterol sulfate HFA (VENTOLIN HFA) 108 (90 Base) MCG/ACT  inhaler Inhale 2 puffs into the lungs 4 times daily as needed for Wheezing (Patient not taking: Reported on 11/10/2023) 18 g 0     No facility-administered encounter medications on file as of 11/17/2023.        ALLERGIES:    Allergies   Allergen Reactions    Ondansetron Other (See Comments)     Tongue numbness  Tongue numbness        HISTORY:  Past Medical History:   Diagnosis Date    ADHD     COVID-19     Environmental allergies     Severe- sees ENT    Hypokalemia     PTSD (post-traumatic stress disorder)       Past Surgical History:   Procedure Laterality Date    HERNIA REPAIR  2018      Social History     Socioeconomic History    Marital status: Single     Spouse name: Not on file    Number of children: Not on file    Years of education: Not on file    Highest education level: Not on file   Occupational  History    Not on file   Tobacco Use    Smoking status: Never     Passive exposure: Never    Smokeless tobacco: Never    Tobacco comments:     Patient counselled on the dangers of tobacco 05/11/2020, Cessation: Advice given   Vaping Use    Vaping status: Never Used   Substance and Sexual Activity    Alcohol use: Yes     Comment: once ever 6-35mo    Drug use: Never    Sexual activity: Yes     Partners: Male   Other Topics Concern    Not on file   Social History Narrative    Not on file     Social Determinants of Health     Financial Resource Strain: Not on file   Food Insecurity: Not on file   Transportation Needs: Not on file   Physical Activity: Not on file   Stress: Not on file   Social Connections: Not on file   Intimate Partner Violence: Not on file   Housing Stability: Not on file      Family History   Problem Relation Age of Onset    Other Mother         Muir-Torre Syndrome -- genetic predisposition for CRC    Hypertension Father     No Known Problems Sister     No Known Problems Brother     No Known Problems Maternal Grandmother     No Known Problems Maternal Grandfather     No Known Problems Paternal Grandmother      No Known Problems Paternal Grandfather     No Known Problems Other         REVIEW OF SYSTEMS:  Review of systems is as indicated in HPI, otherwise negative.    PHYSICAL EXAM:          GENERAL APPEARANCE: alert and oriented to person, place and time, in no acute distress, appears stated age, nontoxic.           HEAD: Normocephalic, atraumatic.Marland Kitchen           EYES:  PERRL, extraocular movement intact (EOMI).           EARS: Diffuse light reflex bilat. Bilat external ear WNL. Bilat EAC clear. Bilat TM intact, clear, nonerythematous, bony landmarks easily identified.Marland Kitchen           NOSE:  Moderate edema of turbinates bilat. Septum intact..           ORAL CAVITY: Posterior pharynx nonerythematous. No tonsillary swelling or exudate. Uvula midline. No peritonsilar abscess. No oral lesions. No trismus. Mucosa moist.           NECK:  Soft, supple, nontender.Marland Kitchen           LYMPH NODES: No posterior cervical or supraclavicular adenopathy.           HEART:  RRR. S1, S2 normal, no murmurs, rubs, or gallops .           LUNGS: Clear to auscultation throughout. No wheezes, rales, rhonchi. Breathing eupnic. Speaking in full sentences.          EXTREMITIES: no edema          SKIN: Warm, dry and intact.Marland Kitchen           PSYCH:  mood/affect appropriate, speech clear    Visit Vitals  BP 110/68   Pulse 77   Wt 80.1 kg (176 lb 8 oz)   SpO2  96%   BMI 25.33 kg/m        LABS  No results found for any visits on 11/17/23.       IMPRESSION/PLAN   Diagnosis Orders   1. Acute cough  XR CHEST (2 VIEWS)      2. Reactive airway disease with acute exacerbation, unspecified asthma severity, unspecified whether persistent  Spacer/Aero-Holding Chambers DEVI      Persistent cough for 3 weeks now improving after treatment for sinusitis which she states he started 4-5 days ago.  Continue Augmentin.  Prescribed spacer for rescue inhaler.  Complete chest x-ray at the end of the week if symptoms fail to improve, immediately for any worsening.  May consider addition of  Z-Pak or ICS depending on symptoms and x-ray results.  Encouraged conservative measures including probiotics.     Follow up and Dispositions:  Return if symptoms worsen or fail to progressively improve.       Janyth Pupa, APRN - NP

## 2023-12-30 ENCOUNTER — Ambulatory Visit
Admit: 2023-12-30 | Discharge: 2023-12-30 | Payer: BLUE CROSS/BLUE SHIELD | Attending: Physician Assistant | Primary: Family Medicine

## 2023-12-30 VITALS — BP 118/79 | HR 104 | Temp 99.80000°F | Resp 18 | Ht 70.0 in | Wt 178.9 lb

## 2023-12-30 DIAGNOSIS — J029 Acute pharyngitis, unspecified: Secondary | ICD-10-CM

## 2023-12-30 LAB — AMB POC INFLUENZA ASSAY W/OPTIC
Flu A Antigen: NEGATIVE
Flu B Antigen: NEGATIVE

## 2023-12-30 LAB — POCT COVID-19 ANTIGEN CARD
Lot Number: 0
SARS-COV-2, POC: NOT DETECTED

## 2023-12-30 LAB — AMB POC RAPID STREP A: Group A Strep Antigen, POC: NEGATIVE

## 2023-12-30 MED ORDER — AMOXICILLIN-POT CLAVULANATE 875-125 MG PO TABS
875-125 | ORAL_TABLET | Freq: Two times a day (BID) | ORAL | 0 refills | Status: AC
Start: 2023-12-30 — End: 2024-01-09

## 2023-12-30 NOTE — Progress Notes (Signed)
 ASSESSMENT/PLAN:     1. Sore throat  -     POCT COVID-19 Antigen Card ([facility]NOW)  -     POC Influenza Assay w/Optic (57846)  -     POC Strep A Assay w/Optic (96295)  2. Body aches  -     POCT COVID-19 Antigen Card ([facility]axNOW)  -     POC Influenza Assay w/Optic (28413)  -     POC Strep A Assay w/Optic (24401)  3. Dizziness  -     POCT COVID-19 Antigen Card ([facility]axNOW)  -     POC Influenza Assay w/Optic (02725)  -     POC Strep A Assay w/Optic (36644)  4. Acute pharyngitis, unspecified etiology  -     amoxicillin-clavulanate (AUGMENTIN) 875-125 MG per tablet; Take 1 tablet by mouth 2 times daily for 10 days, Disp-20 tablet, R-0Normal      Results for orders placed or performed in visit on 12/30/23   POCT COVID-19 Antigen Card ([facility]NOW)   Result Value Ref Range    SARS-COV-2, POC Not-Detected Not Detected    Lot Number 0     QC Pass/Fail pass    POC Influenza Assay w/Optic (03474)   Result Value Ref Range    Valid Internal Control, POC pass     Flu A Antigen negative     Flu B Antigen negative    POC Strep A Assay w/Optic (25956)   Result Value Ref Range    Valid Internal Control, POC valid     Group A Strep Antigen, POC Negative        Covid/flu neg.  Rapid strep neg.  Due to PE findings and symptoms, concern for bacterial pharyngitis.  Treating with antibiotics.  Tylenol and/or motrin for fever/aches.  New medication dosing and side effects reviewed.  Encouraged to complete course of antibiotics.  Recommend supportive care, including rest, hydration, and over-the-counter medications as needed.  Encouraged frequent handwashing.  Do not share drinks, food, utensils, or towels for now.  Change out your toothbrush in several days.  Discussed signs and symptoms to monitor.  Follow-up if no improvement/worsening symptoms.            CHIEF COMPLAINT:  Darius Myers (DOB:  [birth year]) is a 28 y.o. male, here for evaluation of the following chief complaint(s):  Pharyngitis (Sore throat, body aches and dizziness  )      HPI:  Darius Myers is a 28 year old male presenting today with a sore throat, headache, body aches, and slight dizziness that started this morning when he woke up.  His temperature was slightly elevated this morning.  His throat soreness is persistent, worse on the left side.  Denies any cough.  No wheezing or shortness of breath.  His husband woke up with similar symptoms.      Pharyngitis  Associated symptoms: headaches, myalgias and sore throat    Associated symptoms: no chest pain, no cough, no fever and no shortness of breath        CURRENT MEDICATIONS:  Outpatient Medications Marked as Taking for the 12/30/23 encounter (Office Visit) with Jason Nest, PA   Medication Sig Dispense Refill    amoxicillin-clavulanate (AUGMENTIN) 875-125 MG per tablet Take 1 tablet by mouth 2 times daily for 10 days 20 tablet 0    Spacer/Aero-Holding Chambers DEVI 1 Device by Does not apply route as needed (SOB, chest tightness or wheezing) 1 each 0    EPINEPHrine (EPIPEN) 0.3 MG/0.3ML SOAJ injection  albuterol sulfate HFA (VENTOLIN HFA) 108 (90 Base) MCG/ACT inhaler Inhale 2 puffs into the lungs 4 times daily as needed for Wheezing 18 g 0    pantoprazole (PROTONIX) 20 MG tablet       Azelastine HCl 137 MCG/SPRAY SOLN       fluticasone (FLONASE) 50 MCG/ACT nasal spray USE 1 SPRAY(S) IN EACH NOSTRIL TWICE DAILY         REVIEW OF SYSTEMS:  Review of Systems   Constitutional:  Negative for chills and fever.   HENT:  Positive for sore throat.    Respiratory:  Negative for cough and shortness of breath.    Cardiovascular:  Negative for chest pain.   Musculoskeletal:  Positive for myalgias.   Neurological:  Positive for dizziness and headaches.       ALLERGIES:  Allergies   Allergen Reactions    Ondansetron Other (See Comments)     Tongue numbness  Tongue numbness        PAST MEDICAL HISTORY:  Past Medical History:   Diagnosis Date    ADHD     COVID-19     Environmental allergies     Severe- sees ENT    Hypokalemia     PTSD  (post-traumatic stress disorder)        PAST SURGICAL HISTORY:  Past Surgical History:   Procedure Laterality Date    HERNIA REPAIR  2018       FAMILY HISTORY:  Family History   Problem Relation Age of Onset    Other Mother         Muir-Torre Syndrome -- genetic predisposition for CRC    Hypertension Father     No Known Problems Sister     No Known Problems Brother     No Known Problems Maternal Grandmother     No Known Problems Maternal Grandfather     No Known Problems Paternal Grandmother     No Known Problems Paternal Grandfather     No Known Problems Other        TOBACCO HISTORY:  Social History     Tobacco Use   Smoking Status Never    Passive exposure: Never   Smokeless Tobacco Never   Tobacco Comments    Patient counselled on the dangers of tobacco 05/11/2020, Cessation: Advice given       SOCIAL HISTORY:  Social History     Socioeconomic History    Marital status: Single     Spouse name: None    Number of children: None    Years of education: None    Highest education level: None   Tobacco Use    Smoking status: Never     Passive exposure: Never    Smokeless tobacco: Never    Tobacco comments:     Patient counselled on the dangers of tobacco 05/11/2020, Cessation: Advice given   Vaping Use    Vaping status: Never Used   Substance and Sexual Activity    Alcohol use: Yes     Comment: once ever 6-70mo    Drug use: Never    Sexual activity: Yes     Partners: Male       OB HISTORY:  OB History   No obstetric history on file.       VITAL SIGNS:  Vitals:    12/30/23 1040   BP: 118/79   Pulse: (!) 104   Resp: 18   Temp: 99.8 F (37.7 C)   TempSrc: Oral   SpO2:  99%   Weight: 81.1 kg (178 lb 14.4 oz)   Height: 1.778 m (5\' 10" )       PHYSICAL EXAM:  Physical Exam  Vitals reviewed.   Constitutional:       General: He is not in acute distress.     Appearance: He is not toxic-appearing.   HENT:      Head: Normocephalic and atraumatic.      Right Ear: Tympanic membrane normal.      Mouth/Throat:      Pharynx: Posterior  oropharyngeal erythema present. No oropharyngeal exudate.   Eyes:      Extraocular Movements: Extraocular movements intact.      Conjunctiva/sclera: Conjunctivae normal.   Cardiovascular:      Rate and Rhythm: Normal rate and regular rhythm.      Heart sounds: No murmur heard.     No friction rub. No gallop.   Pulmonary:      Effort: Pulmonary effort is normal. No respiratory distress.      Breath sounds: Normal breath sounds. No wheezing or rhonchi.   Musculoskeletal:      Cervical back: Tenderness present.   Skin:     General: Skin is warm and dry.   Neurological:      General: No focal deficit present.      Mental Status: He is alert and oriented to person, place, and time.   Psychiatric:         Mood and Affect: Mood normal.         Behavior: Behavior normal.                An electronic signature was used to authenticate this note.    --Jason Nest, PA

## 2023-12-31 ENCOUNTER — Ambulatory Visit
Admit: 2023-12-31 | Discharge: 2023-12-31 | Payer: BLUE CROSS/BLUE SHIELD | Attending: Family Medicine | Primary: Family Medicine

## 2023-12-31 DIAGNOSIS — F902 Attention-deficit hyperactivity disorder, combined type: Secondary | ICD-10-CM

## 2023-12-31 DIAGNOSIS — Z0000 Encounter for general adult medical examination without abnormal findings: Secondary | ICD-10-CM

## 2023-12-31 MED ORDER — ATOMOXETINE HCL 40 MG PO CAPS
40 | ORAL_CAPSULE | Freq: Every day | ORAL | 4 refills | Status: DC
Start: 2023-12-31 — End: 2024-06-01

## 2023-12-31 NOTE — Telephone Encounter (Signed)
 Cover My Meds *[employer]illie Fantasia*    PA Case ID #: [id number]  Rx #: [id number]    Drug  Atomoxetine HCl 40MG  capsules    This request has been approved using information available on the patient's profile. [id number];Review Type:Prior Auth;Coverage Start Date:12/01/2023;Coverage End Date:12/30/2024;

## 2024-02-01 NOTE — Progress Notes (Signed)
 Darius Myers (DOB:  07-22-1996) is a 28 y.o. male, here for evaluation of the following chief complaint(s):  Annual Exam    Vitals:    12/31/23 1108   BP: 115/76   BP Site: Right Upper Arm   Patient Position: Sitting   BP Cuff Size: Medium Adult   Pulse: 97   SpO2: 96%   Weight: 81.7 kg (180 lb 3.2 oz)   Height: 1.778 m (5\' 10" )      Assessment & Plan   1. Routine general medical examination at a health care facility  2. Attention deficit hyperactivity disorder (ADHD), combined type  -     atomoxetine  (STRATTERA ) 40 MG capsule; Take 1 capsule by mouth daily, Disp-90 capsule, R-4Normal  3. Lynch syndrome    In addition to the above:  Assessment & Plan  1. Attention Deficit Hyperactivity Disorder (ADHD).  - Recently resumed academic pursuits, necessitating the need for ADHD management.  - Strattera  40 mg to be taken in the morning, with the option to adjust to nighttime if fatigue occurs.  - Discussed the convenience of Strattera  over controlled substances like Adderall and Concerta.  - Advised to forward ADHD documentation so we can verify his accurate history, but with non controlled initial options, comfortable starting while we await that data.    2. Lynch Syndrome.  - Undergoing annual colonoscopies and biennial esophagogastroduodenoscopies (EGDs) for routine surveillance.  - History of two ulcers, with a recommendation to repeat EGD every 8 to 12 weeks.  - Advised to schedule a follow-up office visit to address unresolved insurance coverage issues.  - Encouraged to contact insurance provider to verify coverage codes and explore alternative providers if necessary.    3. Health Maintenance.  - Routine age appropriate counseling.  No follow-ups on file.     Subjective   History of Present Illness  The patient presents for evaluation of ADHD and Lynch syndrome. He is accompanied by his husband.    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 This program is scheduled to conclude in 2027. He completed his first term in the second week of April 2025, having started in February 2025. He is currently seeking to resume his Strattera  medication, which he had previously discontinued. Strattera  was highly effective for him in the past, particularly during his college years when he was on a 160 mg dose. However, he believes that a lower dose of 20 mg would suffice at present. His schedule is challenging, as he works night shifts and attends school during the day, making it difficult to maintain a consistent medication routine. He is considering taking his medication in the morning to better manage his time between work and school. He is also contemplating the possibility of discontinuing the medication once he completes his studies.    The patient has been diagnosed with Lynch syndrome and is under the care of a gastroenterologist. He underwent a colonoscopy and endoscopy in 2023, following which he was advised to undergo these procedures annually. However, due to financial constraints, he has been unable to comply with this recommendation. Negotiations with his insurance company have reduced the cost of these procedures from $6000 to $3000. He is currently exploring the possibility of having these procedures pre-approved by his insurance company to avoid further financial strain.    He visited urgent care yesterday and is being treated for strep. A Z-Pak  was prescribed due to his cough.         Objective   Physical Exam   Physical Exam  General Appearance:  Well appearing, developed and nourished.  Appears stated age.  No acute distress.  Head:  Normocephalic, atraumatic.  Eyes:  Pupils equal, round and reactive to light.  Extraocular muscle movements intact  Ears:  External auditory canals clear, tympanic membranes intact.  Hearing grossly intact.  Nose:  Nares patent, no lesions.  Oral Cavity:  Mucosa moist, tongue in  midline.  No oropharyngeal lesions.  Neck:  Supple, full range of motion.  No cervical lymphadenopathy.  No thyroid enlargement.  Heart:  Regular rate and regular rhythm.  No murmurs.  No rubs or gallops.  Lungs:  Clear to auscultation bilaterally with good air movement, no focal adventitial sounds.  Abdomen:  Non tender to palpation and non distended.  No masses or hernias. No organomegaly.  Skin:  Normal turgor.  No rashes noted.  Skin is warm and dry.  Musculoskeletal:  Full range of motion with no swelling or deformity.  Extremities:  No edema.  No clubbing or cyanosis.  Neurologic:  Motor strength normal in the upper and lower extremities.  Sensory exam intact.  Psych:  Cognitive function intact.  Judgment and insight normal.  Mood and affect appropriate.          Office Visit on 12/30/2023   Component Date Value Ref Range Status    SARS-COV-2, POC 12/30/2023 Not-Detected  Not Detected Final    Lot Number 12/30/2023 0   Final    QC Pass/Fail 12/30/2023 pass   Final    Valid Internal Control, POC 12/30/2023 pass   Final    Flu A Antigen 12/30/2023 negative   Final    Flu B Antigen 12/30/2023 negative   Final    Valid Internal Control, POC 12/30/2023 valid   Final    Group A Strep Antigen, POC 12/30/2023 Negative   Final   Office Visit on 10/21/2023   Component Date Value Ref Range Status    Valid Internal Control, POC 10/21/2023 pass   Final    Group A Strep Antigen, POC 10/21/2023 Negative   Final    FINAL REPORT 10/21/2023 Normal Respiratory Flora   Final    FINAL REPORT 10/21/2023 No Beta Hemolytic Streptococcus Isolated   Final   Office Visit on 10/04/2023   Component Date Value Ref Range Status    Valid Internal Control, POC 10/04/2023 pass   Final    Group A Strep Antigen, POC 10/04/2023 Positive   Final   Orders Only on 08/18/2023   Component Date Value Ref Range Status    Test Name 08/18/2023 [facility]/GC/PHARYNGEAL SWAB   Final    Reference Lab 08/18/2023 [facility]   Final    test code 08/18/2023 161096   Final     Test Results 08/18/2023 Seperate Report   Final   Office Visit on 08/18/2023   Component Date Value Ref Range Status    Valid Internal Control, POC 08/18/2023 PASS   Final    Flu A Antigen 08/18/2023 NEGATIVE   Final    Flu B Antigen 08/18/2023 NEGATIVE   Final   Admission on 08/16/2023, Discharged on 08/17/2023   Component Date Value Ref Range Status    Ventricular Rate 08/16/2023 79  BPM Final    P-R Interval 08/16/2023 138  ms Final    QRS Duration 08/16/2023 89  ms Final    Q-T Interval 08/16/2023  347  ms Final    QTc Calculation (Bazett) 08/16/2023 398  ms Final    P Axis 08/16/2023 40  degrees Final    R Axis 08/16/2023 84  degrees Final    T Axis 08/16/2023 32  degrees Final    Diagnosis 08/16/2023    Final                    Value:SINUS RHYTHM  NONSPECIFIC ST   BORDERLINE ECG  UNCONFIRMED REPORT    Confirmed by Dedrick Fanti, Sofy ([id number]) on 08/17/2023 12:54:50 PM      WBC 08/16/2023 9.7  3.8 - 10.6 x10e3/mcL Final    RBC 08/16/2023 4.70  4.00 - 5.60 x10e6/mcL Final    Hemoglobin 08/16/2023 14.7  13.0 - 17.3 g/dL Final    Hematocrit 09/81/1914 41.9  38.0 - 52.0 % Final    MCV 08/16/2023 89.1  84.0 - 100.0 fL Final    MCH 08/16/2023 31.3  27.0 - 34.5 pg Final    MCHC 08/16/2023 35.1  30.0 - 36.0 g/dL Final    RDW 78/29/5621 12.3  10.0 - 17.0 % Final    Platelets 08/16/2023 259  140 - 440 x10e3/mcL Final    MPV 08/16/2023 10.3  7.0 - 12.2 fL Final    Neutrophils % 08/16/2023 65.5  42.0 - 74.0 % Final    Lymphocytes 08/16/2023 23.1  15.0 - 45.0 % Final    Monocytes % 08/16/2023 6.2  4.0 - 12.0 % Final    Eosinophils % 08/16/2023 3.7  0.0 - 7.0 % Final    Basophils % 08/16/2023 0.4  0.0 - 2.0 % Final    Neutrophils Absolute 08/16/2023 6.3  1.6 - 7.3 x10e3/mcL Final    Lymphocytes Absolute 08/16/2023 2.2  1.0 - 3.2 x10e3/mcL Final    Monocytes Absolute 08/16/2023 0.6  0.3 - 1.0 x10e3/mcL Final    Eosinophils Absolute 08/16/2023 0.4  0.0 - 0.5 x10e3/mcL Final    Basophils Absolute 08/16/2023 0.0  0.0 - 0.2 x10e3/mcL  Final    Immature Granulocytes % 08/16/2023 1.1 (H)  0.0 - 0.6 % Final    Immature Grans (Abs) 08/16/2023 0.11 (H)  0.00 - 0.06 x10e3/mcL Final    Sodium 08/16/2023 139  135 - 145 mmol/L Final    Potassium 08/16/2023 3.8  3.5 - 5.3 mmol/L Final    Chloride 08/16/2023 104  98 - 107 mmol/L Final    CO2 08/16/2023 24  22 - 29 mmol/L Final    Glucose 08/16/2023 98  70 - 99 mg/dL Final    BUN 30/86/5784 12  6 - 20 mg/dL Final    Creatinine 69/62/9528 0.8  0.7 - 1.3 mg/dL Final    Anion Gap 41/32/4401 11  2 - 17 mmol/L Final    Osmolaliy Calculated 08/16/2023 277  270 - 287 mOsm/kg Final    Calcium 08/16/2023 8.9  8.5 - 10.7 mg/dL Final    Est, Glom Filt Rate 08/16/2023 125  >=60 mL/min/1.3m Final    Color, UA 08/16/2023 Straw   Final    Appearance 08/16/2023 Clear   Final    Glucose, Ur 08/16/2023 Negative  Negative Final    Bilirubin, Urine 08/16/2023 Negative  Negative Final    Ketones, Urine 08/16/2023 Negative  Negative Final    Specific Gravity, UA 08/16/2023 1.025  1.003 - 1.035 Final    Blood, Urine 08/16/2023 Negative  Negative Final    pH, Urine 08/16/2023 6.0  4.5 - 8.0 Final  Protein, UA 08/16/2023 Negative  Negative Final    Urobilinogen, Urine 08/16/2023 0.2  >1 EU/dL Final    Nitrite, Urine 08/16/2023 Negative  Negative Final    Leukocyte Esterase, Urine 08/16/2023 Negative  Negative Final   Office Visit on 07/09/2023   Component Date Value Ref Range Status    SARS-CoV-2 07/09/2023 Not Detected  Not Detected Final    Influenza A 07/09/2023 Not Detected  Not Detected Final    Influenza B 07/09/2023 Not Detected  Not Detected Final   Office Visit on 05/30/2023   Component Date Value Ref Range Status    Valid Internal Control, POC 05/30/2023 PASS   Final    Group A Strep Antigen, POC 05/30/2023 Negative   Final    Internal Control 05/30/2023 PASS   Final    Mononucleosis Screen, POC 05/30/2023 NEGATIVE   Final    Test Name 05/31/2023 Throat swab for gon, chly   Final    Reference Lab 05/31/2023 [facility]    Final    test code 05/31/2023 [id number]   Final    Test Results 05/31/2023 Seperate Report   Final   Orders Only on 04/23/2023   Component Date Value Ref Range Status    FINAL REPORT 04/23/2023 Normal Respiratory Flora isolated.   Final    FINAL REPORT 04/23/2023 No Beta Hemolytic Streptococcus Isolated   Final   Office Visit on 04/23/2023   Component Date Value Ref Range Status    Valid Internal Control, POC 04/23/2023 yes   Final    Group A Strep Antigen, POC 04/23/2023 Negative   Final   There may be more visits with results that are not included.      Office Visit on 12/30/2023   Component Date Value Ref Range Status    SARS-COV-2, POC 12/30/2023 Not-Detected  Not Detected Final    Lot Number 12/30/2023 0   Final    QC Pass/Fail 12/30/2023 pass   Final    Valid Internal Control, POC 12/30/2023 pass   Final    Flu A Antigen 12/30/2023 negative   Final    Flu B Antigen 12/30/2023 negative   Final    Valid Internal Control, POC 12/30/2023 valid   Final    Group A Strep Antigen, POC 12/30/2023 Negative   Final      No results found for this visit on 12/31/23.   No results found for any visits on 12/31/23.  Allergies   Allergen Reactions    Ondansetron Other (See Comments)     Tongue numbness  Tongue numbness      Prior to Admission medications    Medication Sig Start Date End Date Taking? Authorizing Provider   atomoxetine  (STRATTERA ) 40 MG capsule Take 1 capsule by mouth daily 12/31/23  Yes Kalyan Barabas D, MD   Spacer/Aero-Holding Idelle Majors DEVI 1 Device by Does not apply route as needed (SOB, chest tightness or wheezing) 11/17/23   Magali Schmitz, APRN - NP   EPINEPHrine [facility]) 0.3 MG/0.3ML SOAJ injection  06/24/23   [provider]   albuterol  sulfate HFA (VENTOLIN  HFA) 108 (90 Base) MCG/ACT inhaler Inhale 2 puffs into the lungs 4 times daily as needed for Wheezing 04/19/23   Dail Drought A, PA   pantoprazole (PROTONIX) 20 MG tablet  04/07/23   [provider]   Azelastine HCl 137 MCG/SPRAY SOLN   02/15/21   [provider]   fluticasone (FLONASE) 50 MCG/ACT nasal spray USE 1 SPRAY(S) IN EACH NOSTRIL TWICE DAILY  02/15/21   [provider]      Family History   Problem Relation Age of Onset    Other Mother         Muir-Torre Syndrome -- genetic predisposition for CRC    Hypertension Father     No Known Problems Sister     No Known Problems Brother     No Known Problems Maternal Grandmother     No Known Problems Maternal Grandfather     No Known Problems Paternal Grandmother     No Known Problems Paternal Grandfather     No Known Problems Other       Social History     Socioeconomic History    Marital status: Single     Spouse name: Not on file    Number of children: Not on file    Years of education: Not on file    Highest education level: Not on file   Occupational History    Not on file   Tobacco Use    Smoking status: Never     Passive exposure: Never    Smokeless tobacco: Never    Tobacco comments:     Patient counselled on the dangers of tobacco 05/11/2020, Cessation: Advice given   Vaping Use    Vaping status: Never Used   Substance and Sexual Activity    Alcohol use: Yes     Comment: once ever 6-76mo    Drug use: Never    Sexual activity: Yes     Partners: Male   Other Topics Concern    Not on file   Social History Narrative    Not on file     Social Drivers of Health     Financial Resource Strain: Not on file   Food Insecurity: Not on file   Transportation Needs: Not on file   Physical Activity: Not on file   Stress: Not on file   Social Connections: Not on file   Intimate Partner Violence: Not on file   Housing Stability: Not on file      Past Surgical History:   Procedure Laterality Date    HERNIA REPAIR  2018      Past Medical History:   Diagnosis Date    ADHD     COVID-19     Environmental allergies     Severe- sees ENT    Hypokalemia     PTSD (post-traumatic stress disorder)             The patient (or guardian, if applicable) and other individuals in attendance with the patient were  advised that Artificial Intelligence will be utilized during this visit to record and process the conversation to generate a clinical note. The patient (or guardian, if applicable) and other individuals in attendance at the appointment consented to the use of AI, including the recording.      An electronic signature was used to authenticate this note.  --Odessia Benedict, MD

## 2024-02-25 ENCOUNTER — Ambulatory Visit: Admit: 2024-02-25 | Discharge: 2024-02-25 | Payer: BLUE CROSS/BLUE SHIELD | Primary: Family Medicine

## 2024-02-25 ENCOUNTER — Encounter

## 2024-02-25 VITALS — BP 119/83 | HR 83 | Temp 98.00000°F | Resp 16 | Ht 70.0 in | Wt 177.9 lb

## 2024-02-25 DIAGNOSIS — R1033 Periumbilical pain: Secondary | ICD-10-CM

## 2024-02-25 NOTE — Progress Notes (Signed)
 CHIEF COMPLAINT:  Chief Complaint   Patient presents with    Abdominal Pain      Lower abdominal pain since Friday, diarrhea on Friday only. Denies na or vomiting. Pt states he also has rectum pain/irritation. Pt has a normal bowel movement everyday, just burns when coming out and painful        HISTORY OF PRESENT ILLNESS:  Start low inf umbilicus and radiates to top of umbilicus.   Intermittent staying around for a couple o fhours.   Eating does not make better or worse  No N/V/ No fev chills  Up onto last Friday he report normal Bms soft regular but last Friday had diarrhea.    BM s normal with 2 episodes per day.  Soft and easy to evacuate.   Describes AP as sharp stabbing pain.  No bloating. Able to eat and drink regularly.   Today felt a burning rectum pain during BM.  Lasted about 5 minutes afterwards. Smaller than normal BM today.  Went to call husband and pain was so bad he had to lay down fetal position for 1-2 minutes.   Tried TUMS and watchful waiting.   Some anal itching last Friday usually associated with sweating.   Monogamous same sex relationship with intercourse, but open to oral encounters.     CURRENT MEDICATION LIST:    Current Outpatient Medications   Medication Sig Dispense Refill    cetirizine  (ZYRTEC ) 10 MG tablet Take 1 tablet by mouth daily      atomoxetine  (STRATTERA ) 40 MG capsule Take 1 capsule by mouth daily 90 capsule 4    Spacer/Aero-Holding Chambers DEVI 1 Device by Does not apply route as needed (SOB, chest tightness or wheezing) 1 each 0    EPINEPHrine (EPIPEN) 0.3 MG/0.3ML SOAJ injection       albuterol  sulfate HFA (VENTOLIN  HFA) 108 (90 Base) MCG/ACT inhaler Inhale 2 puffs into the lungs 4 times daily as needed for Wheezing 18 g 0    pantoprazole (PROTONIX) 20 MG tablet       Azelastine HCl 137 MCG/SPRAY SOLN       fluticasone (FLONASE) 50 MCG/ACT nasal spray USE 1 SPRAY(S) IN EACH NOSTRIL TWICE DAILY       No current facility-administered medications for this visit.         ALLERGIES:    Allergies   Allergen Reactions    Ondansetron Other (See Comments)     Tongue numbness  Tongue numbness        HISTORY:  Past Medical History:   Diagnosis Date    ADHD     COVID-19     Environmental allergies     Severe- sees ENT    Hypokalemia     PTSD (post-traumatic stress disorder)       Past Surgical History:   Procedure Laterality Date    HERNIA REPAIR  2018      Social History     Socioeconomic History    Marital status: Single     Spouse name: Not on file    Number of children: Not on file    Years of education: Not on file    Highest education level: Not on file   Occupational History    Not on file   Tobacco Use    Smoking status: Never     Passive exposure: Never    Smokeless tobacco: Never    Tobacco comments:     Patient counselled on the dangers of tobacco 05/11/2020,  Cessation: Advice given   Vaping Use    Vaping status: Never Used   Substance and Sexual Activity    Alcohol use: Yes     Comment: once ever 6-30mo    Drug use: Never    Sexual activity: Yes     Partners: Male   Other Topics Concern    Not on file   Social History Narrative    Not on file     Social Drivers of Health     Financial Resource Strain: Not on file   Food Insecurity: Not on file   Transportation Needs: Not on file   Physical Activity: Not on file   Stress: Not on file   Social Connections: Not on file   Intimate Partner Violence: Not on file   Housing Stability: Not on file      Family History   Problem Relation Age of Onset    Other Mother         Muir-Torre Syndrome -- genetic predisposition for CRC    Hypertension Father     No Known Problems Sister     No Known Problems Brother     No Known Problems Maternal Grandmother     No Known Problems Maternal Grandfather     No Known Problems Paternal Grandmother     No Known Problems Paternal Grandfather     No Known Problems Other         REVIEW OF SYSTEMS:  Review of Systems   Constitutional: Negative.    Gastrointestinal:  Positive for abdominal pain and rectal  pain. Negative for constipation, diarrhea, nausea and vomiting.   Genitourinary:  Negative for decreased urine volume, dysuria, flank pain, frequency, genital sores, hematuria, penile discharge, penile pain, penile swelling, scrotal swelling, testicular pain and urgency.        PHYSICAL EXAM:  Physical Exam  Vitals and nursing note reviewed.   Constitutional:       General: He is not in acute distress.     Appearance: Normal appearance. He is not ill-appearing, toxic-appearing or diaphoretic.   HENT:      Head: Normocephalic and atraumatic.   Cardiovascular:      Rate and Rhythm: Normal rate and regular rhythm.      Heart sounds: Normal heart sounds.   Pulmonary:      Effort: Pulmonary effort is normal.      Breath sounds: Normal breath sounds.   Abdominal:      General: Abdomen is flat. Bowel sounds are decreased. There is no distension.      Palpations: Abdomen is soft.      Tenderness: There is abdominal tenderness in the epigastric area and periumbilical area. There is no right CVA tenderness or left CVA tenderness.       Skin:     General: Skin is warm and dry.   Neurological:      General: No focal deficit present.      Mental Status: He is alert and oriented to person, place, and time. Mental status is at baseline.   Psychiatric:         Mood and Affect: Mood normal.         Behavior: Behavior normal.         Judgment: Judgment normal.        Vital Signs -   Visit Vitals  BP 119/83   Pulse 83   Temp 98 F (36.7 C)   Resp 16   Ht 1.778 m (5'  10")   Wt 80.7 kg (177 lb 14.4 oz)   SpO2 99%   BMI 25.53 kg/m            LABS  No results found for this visit on 02/25/24.      TREATMENT:    1. Periumbilical abdominal pain  -     Culture, Urine; Future  -     Chlamydia/Neisseria/Trichomonas NAA; Future  -     Genital Mycoplasmas NAA, Urine; Future     28 year old male presents with abdominal pain x 1 week without any systemic complaints.  He is alert and oriented in no acute distress warm and dry and vital signs are  stable.  Has some midepigastric/inferior umbilical pain to deep palpation.  Discussed need for further evaluation with a CAT scan of abdomen and pelvis.  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  Will go ahead and do STI screening and a urine culture and treat accordingly.      EDUCATION:   Patient Instructions   As we discussed in the office currently you are hemodynamically stable and in no acute distress.  He you currently have no fever, chills, nausea, vomiting, or diarrhea.  Should you develop any of those or new or worsening abdominal pain (more localized to right lower quadrant of pain or a new area on top of the umbilical pain) you are to proceed directly to the emergency room.    In meantime you need to follow-up with your primary care provider regarding your abdominal pain.  Would recommend CAT scan of the abdomen and pelvis.    Will call with any positive results and treatment plan.        Follow up and Dispositions:  Return if symptoms worsen or fail to improve.       Kathleene Papas, FNP    I have reviewed prior visit notes and lab results pertinent to this visit. Point of care results and imaging were independently interpreted by myself and discussed with the patient. Educated the patient on disease process, expected course of illness, and management. Educated the patient on how to administer prescribed medications and possible side effects.  Patient instructed to follow-up immediately for any new or worsening symptoms.  Patient verbalized understanding and agreement with plan of care.    Please note that this note was generated using voice recognition Scientist, clinical (histocompatibility and immunogenetics).  Although every effort was made to ensure the accuracy of this automated transcription, some errors in transcription may have occurred.

## 2024-02-25 NOTE — Patient Instructions (Signed)
 As we discussed in the office currently you are hemodynamically stable and in no acute distress.  He you currently have no fever, chills, nausea, vomiting, or diarrhea.  Should you develop any of those or new or worsening abdominal pain (more localized to right lower quadrant of pain or a new area on top of the umbilical pain) you are to proceed directly to the emergency room.    In meantime you need to follow-up with your primary care provider regarding your abdominal pain.  Would recommend CAT scan of the abdomen and pelvis.    Will call with any positive results and treatment plan.

## 2024-02-26 LAB — C.TRACHOMATIS N.GONORRHOEAE T. VAGINALI, MOLECULAR, URINE
Chlamydia Trachomatis, NAA, Urine: NEGATIVE
Neisseria gonorrhoeae, NAA, Urine: NEGATIVE
Trichomonas vaginalis DNA, Urine: NEGATIVE

## 2024-02-26 LAB — CULTURE, URINE: FINAL REPORT: NO GROWTH

## 2024-02-26 NOTE — Other (Signed)
 Please advise patient that his urine culture came back showing no growth.  No need for antibiotic.

## 2024-02-27 NOTE — Telephone Encounter (Signed)
 Attempted to call pt, left a VM to call the office back. Will try again later

## 2024-02-27 NOTE — Telephone Encounter (Signed)
 Spoke with pt about urine culture results, he asked about his swab results, I told him there were no results in for a swab at this time.

## 2024-03-02 LAB — MISCELLANEOUS SENDOUT: test code: 180040

## 2024-05-24 ENCOUNTER — Ambulatory Visit: Admit: 2024-05-24 | Discharge: 2024-05-24 | Payer: BLUE CROSS/BLUE SHIELD | Primary: Family Medicine

## 2024-05-24 VITALS — BP 129/79 | HR 87 | Temp 98.80000°F | Resp 16 | Ht 70.0 in | Wt 185.9 lb

## 2024-05-24 DIAGNOSIS — J029 Acute pharyngitis, unspecified: Principal | ICD-10-CM

## 2024-05-24 NOTE — Progress Notes (Signed)
 CHIEF COMPLAINT:  Chief Complaint   Patient presents with    Pharyngitis     Pt wants to be tested for oral gonorrhea, tested positive for it 2 years ago and having the same sx         HISTORY OF PRESENT ILLNESS:  Patient has had a sore throat for approximately 1 month.  He states it is not severe it is more of just achy and uncomfortable.  He still able to eat and drink normally.  No fevers chills nausea vomiting or systemic symptoms.  He states he did have symptoms similar to this about 2 years ago and it ended up being gonorrhea.  He would like to be tested for that specifically.  He has had allergies before and states it could be postnasal drip but wants to make sure.      CURRENT MEDICATION LIST:    Current Outpatient Medications   Medication Sig Dispense Refill    cetirizine  (ZYRTEC ) 10 MG tablet Take 1 tablet by mouth daily      atomoxetine  (STRATTERA ) 40 MG capsule Take 1 capsule by mouth daily 90 capsule 4    Spacer/Aero-Holding Chambers DEVI 1 Device by Does not apply route as needed (SOB, chest tightness or wheezing) 1 each 0    EPINEPHrine (EPIPEN) 0.3 MG/0.3ML SOAJ injection       albuterol  sulfate HFA (VENTOLIN  HFA) 108 (90 Base) MCG/ACT inhaler Inhale 2 puffs into the lungs 4 times daily as needed for Wheezing 18 g 0    pantoprazole (PROTONIX) 20 MG tablet       Azelastine HCl 137 MCG/SPRAY SOLN       fluticasone (FLONASE) 50 MCG/ACT nasal spray USE 1 SPRAY(S) IN EACH NOSTRIL TWICE DAILY       No current facility-administered medications for this visit.        ALLERGIES:    Allergies   Allergen Reactions    Ondansetron Other (See Comments)     Tongue numbness  Tongue numbness        HISTORY:  Past Medical History:   Diagnosis Date    ADHD     COVID-19     Environmental allergies     Severe- sees ENT    Hypokalemia     PTSD (post-traumatic stress disorder)       Past Surgical History:   Procedure Laterality Date    HERNIA REPAIR  2018      Social History     Socioeconomic History    Marital status:  Single     Spouse name: Not on file    Number of children: Not on file    Years of education: Not on file    Highest education level: Not on file   Occupational History    Not on file   Tobacco Use    Smoking status: Never     Passive exposure: Never    Smokeless tobacco: Never    Tobacco comments:     Patient counselled on the dangers of tobacco 05/11/2020, Cessation: Advice given   Vaping Use    Vaping status: Never Used   Substance and Sexual Activity    Alcohol use: Yes     Comment: once ever 6-54mo    Drug use: Never    Sexual activity: Yes     Partners: Male   Other Topics Concern    Not on file   Social History Narrative    Not on file     Social Drivers  of Health     Financial Resource Strain: Not on file   Food Insecurity: Not on file   Transportation Needs: Not on file   Physical Activity: Not on file   Stress: Not on file   Social Connections: Not on file   Intimate Partner Violence: Not on file   Housing Stability: Not on file      Family History   Problem Relation Age of Onset    Other Mother         Muir-Torre Syndrome -- genetic predisposition for CRC    Hypertension Father     No Known Problems Sister     No Known Problems Brother     No Known Problems Maternal Grandmother     No Known Problems Maternal Grandfather     No Known Problems Paternal Grandmother     No Known Problems Paternal Grandfather     No Known Problems Other         REVIEW OF SYSTEMS    Review of Systems   Constitutional:  Negative for chills and fever.   HENT:  Positive for sore throat. Negative for congestion, postnasal drip and sinus pressure.    Respiratory:  Negative for cough and shortness of breath.    Cardiovascular:  Negative for chest pain.   Gastrointestinal:  Negative for abdominal pain.   Genitourinary:  Negative for dysuria and frequency.   Neurological:  Negative for dizziness and weakness.        PHYSICAL EXAM:    Physical Exam  Constitutional:       Appearance: Normal appearance.   HENT:      Head: Normocephalic.       Right Ear: Tympanic membrane and ear canal normal.      Left Ear: Tympanic membrane and ear canal normal.      Nose: No congestion or rhinorrhea.      Comments: Pale Boggy turbs     Mouth/Throat:      Mouth: Mucous membranes are moist.      Pharynx: Oropharynx is clear. No oropharyngeal exudate or posterior oropharyngeal erythema.   Eyes:      Conjunctiva/sclera: Conjunctivae normal.   Cardiovascular:      Rate and Rhythm: Normal rate and regular rhythm.   Pulmonary:      Effort: Pulmonary effort is normal.   Musculoskeletal:      Cervical back: Neck supple.   Skin:     General: Skin is warm and dry.   Neurological:      Mental Status: He is alert.        Vital Signs -   Visit Vitals  BP 129/79   Pulse 87   Temp 98.8 F (37.1 C)   Resp 16   Ht 1.778 m (5' 10)   Wt 84.3 kg (185 lb 14.4 oz)   SpO2 98%   BMI 26.67 kg/m                LABS and XRAY  No results found for any visits on 05/24/24.           ASSESMENT AND PLAN:    Assessment & Plan  Pharyngitis, unspecified etiology       Orders:    CT/NG by NAA, Pharyngeal    Screening examination for STD (sexually transmitted disease)       Orders:    CT/NG by NAA, Pharyngeal    Will send pharyngeal culture and follow-up based on results.  Return to clinic  for any new or worsening symptoms.  Patient voiced understanding.      EDUCATION:   There are no Patient Instructions on file for this visit.        Follow up and Dispositions:  No follow-ups on file.       Darice Ari Engelbrecht, PA    I have reviewed prior visit notes and lab results pertinent to this visit. Point of care results and imaging were independently interpreted by myself and discussed with the patient. Educated the patient on disease process, expected course of illness, and management. Educated the patient on how to administer prescribed medications and possible side effects.  Patient instructed to follow-up immediately for any new or worsening symptoms.  Patient verbalized understanding and agreement with plan of  care.    Please note that this note was generated using voice recognition Scientist, clinical (histocompatibility and immunogenetics).  Although every effort was made to ensure the accuracy of this automated transcription, some errors in transcription may have occurred.

## 2024-05-30 LAB — MISCELLANEOUS SENDOUT: test code: 188698

## 2024-05-31 NOTE — Telephone Encounter (Signed)
 Please call patient and let him know that his throat culture was negative.  Thank you

## 2024-06-01 ENCOUNTER — Ambulatory Visit
Admit: 2024-06-01 | Discharge: 2024-06-01 | Payer: BLUE CROSS/BLUE SHIELD | Attending: Family Medicine | Primary: Family Medicine

## 2024-06-01 DIAGNOSIS — Z7252 High risk homosexual behavior: Principal | ICD-10-CM

## 2024-06-01 MED ORDER — ATOMOXETINE HCL 40 MG PO CAPS
40 | ORAL_CAPSULE | Freq: Every day | ORAL | 4 refills | Status: AC
Start: 2024-06-01 — End: ?

## 2024-06-01 MED ORDER — EMTRICITABINE-TENOFOVIR DF 200-300 MG PO TABS
200-300 | ORAL_TABLET | Freq: Every day | ORAL | 4 refills | Status: AC
Start: 2024-06-01 — End: ?

## 2024-06-01 NOTE — Telephone Encounter (Signed)
Pt informed

## 2024-06-01 NOTE — Telephone Encounter (Signed)
-----   Message from Darice Dodge, [place] sent at 05/31/2024  8:43 AM EDT -----      ----- Message -----  From: [facility]  Sent: 05/30/2024   3:37 PM EDT  To: Darice Dodge, PA

## 2024-06-02 ENCOUNTER — Inpatient Hospital Stay: Admit: 2024-06-02 | Discharge: 2024-06-02 | Payer: BLUE CROSS/BLUE SHIELD | Primary: Family Medicine

## 2024-06-02 DIAGNOSIS — Z0000 Encounter for general adult medical examination without abnormal findings: Principal | ICD-10-CM

## 2024-06-02 LAB — CBC
Hematocrit: 45.5 % (ref 38.0–52.0)
Hemoglobin: 15.7 g/dL (ref 13.0–17.3)
MCH: 30.7 pg (ref 27.0–34.5)
MCHC: 34.5 g/dL (ref 30.0–36.0)
MCV: 89 fL (ref 84.0–100.0)
MPV: 10.4 fL (ref 7.0–12.2)
Platelets: 275 x10e3/mcL (ref 140–440)
RBC: 5.11 x10e6/mcL (ref 4.00–5.60)
RDW: 11.9 % (ref 10.0–17.0)
WBC: 8.3 x10e3/mcL (ref 3.8–10.6)

## 2024-06-02 LAB — LIPID PANEL
Chol/HDL Ratio: 3.7 (ref 0.0–4.4)
Cholesterol, Total: 149 mg/dL (ref 100–200)
HDL: 40 mg/dL (ref >=40)
LDL Cholesterol: 86 mg/dL (ref 0.0–100.0)
LDL/HDL Ratio: 2.2
Triglycerides: 115 mg/dL (ref 0–149)
VLDL: 23 mg/dL (ref 5.0–40.0)

## 2024-06-02 LAB — COMPREHENSIVE METABOLIC PANEL
ALT: 24 U/L (ref 0–42)
AST: 16 U/L (ref 0–46)
Albumin/Globulin Ratio: 1.79 (ref 1.00–2.70)
Albumin: 5 g/dL (ref 3.5–5.2)
Alk Phosphatase: 78 U/L (ref 40–130)
Anion Gap: 15 mmol/L (ref 2–17)
BUN: 15 mg/dL (ref 6–20)
CO2: 24 mmol/L (ref 22–29)
Calcium: 9.8 mg/dL (ref 8.5–10.7)
Chloride: 102 mmol/L (ref 98–107)
Creatinine: 1 mg/dL (ref 0.7–1.3)
Est, Glom Filt Rate: 106 mL/min/1.73m (ref >=60)
Globulin: 2.8 g/dL (ref 1.9–4.4)
Glucose: 95 mg/dL (ref 70–99)
Osmolaliy Calculated: 282 mosm/kg (ref 270–287)
Potassium: 4 mmol/L (ref 3.5–5.3)
Sodium: 141 mmol/L (ref 135–145)
Total Bilirubin: 0.73 mg/dL (ref 0.00–1.20)
Total Protein: 7.8 g/dL (ref 5.7–8.3)

## 2024-06-02 LAB — URINALYSIS W/ RFLX MICROSCOPIC
Bilirubin, Urine: NEGATIVE
Blood, Urine: NEGATIVE
Glucose, Ur: NEGATIVE
Ketones, Urine: NEGATIVE
Leukocyte Esterase, Urine: NEGATIVE
Nitrite, Urine: NEGATIVE
Protein, UA: NEGATIVE
Specific Gravity, UA: 1.01 (ref 1.003–1.035)
Urobilinogen, Urine: 0.2 EU/dL (ref 0.2–1.0)
pH, Urine: 6 (ref 4.5–8.0)

## 2024-06-02 LAB — HIV SCREEN: HIV Screen: NEGATIVE

## 2024-06-02 LAB — HEPATITIS C ANTIBODY: Hepatitis C Ab: NEGATIVE

## 2024-06-11 ENCOUNTER — Ambulatory Visit
Admit: 2024-06-11 | Discharge: 2024-06-11 | Payer: BLUE CROSS/BLUE SHIELD | Attending: Family Medicine | Primary: Family Medicine

## 2024-06-11 VITALS — BP 128/78 | HR 96 | Temp 99.20000°F | Resp 17 | Ht 70.0 in | Wt 184.0 lb

## 2024-06-11 DIAGNOSIS — J0100 Acute maxillary sinusitis, unspecified: Principal | ICD-10-CM

## 2024-06-11 MED ORDER — PREDNISONE 20 MG PO TABS
20 | ORAL_TABLET | Freq: Every day | ORAL | 0 refills | Status: AC
Start: 2024-06-11 — End: 2024-06-16

## 2024-06-11 MED ORDER — AMOXICILLIN-POT CLAVULANATE 875-125 MG PO TABS
875-125 | ORAL_TABLET | Freq: Two times a day (BID) | ORAL | 0 refills | Status: AC
Start: 2024-06-11 — End: 2024-06-21

## 2024-06-11 NOTE — Progress Notes (Signed)
 Subjective:sinus pressure      Patient ID: Darius Myers     HPI The patient is a 28 y.o. male with sinus pressure congestion for past week using otc meds no nvd no rash    Review of Systems: As noted in the HPI    Past Medical History:   Diagnosis Date    ADHD     COVID-19     Environmental allergies     Severe- sees ENT    Hypokalemia     PTSD (post-traumatic stress disorder)         Past Surgical History:   Procedure Laterality Date    HERNIA REPAIR  2018        Allergies   Allergen Reactions    Ondansetron Other (See Comments)     Tongue numbness  Tongue numbness        Current Outpatient Medications   Medication Sig Dispense Refill    atomoxetine  (STRATTERA ) 40 MG capsule Take 1 capsule by mouth daily 90 capsule 4    emtricitabine -tenofovir  (TRUVADA ) 200-300 MG per tablet Take 1 tablet by mouth daily 90 tablet 4    cetirizine  (ZYRTEC ) 10 MG tablet Take 1 tablet by mouth daily      Spacer/Aero-Holding Chambers DEVI 1 Device by Does not apply route as needed (SOB, chest tightness or wheezing) 1 each 0    EPINEPHrine (EPIPEN) 0.3 MG/0.3ML SOAJ injection       albuterol  sulfate HFA (VENTOLIN  HFA) 108 (90 Base) MCG/ACT inhaler Inhale 2 puffs into the lungs 4 times daily as needed for Wheezing 18 g 0    pantoprazole (PROTONIX) 20 MG tablet       Azelastine HCl 137 MCG/SPRAY SOLN       fluticasone (FLONASE) 50 MCG/ACT nasal spray USE 1 SPRAY(S) IN EACH NOSTRIL TWICE DAILY       No current facility-administered medications for this visit.        BP 139/84   Pulse 96   Temp 99.2 F (37.3 C)   Resp 17   Ht 1.778 m (5' 10)   Wt 83.5 kg (184 lb)   SpO2 99%   BMI 26.40 kg/m       Objective:   Physical Exam  Vitals reviewed.   Constitutional:       General: He is not in acute distress.     Appearance: Normal appearance. He is not ill-appearing, toxic-appearing or diaphoretic.   HENT:      Head: Normocephalic.      Right Ear: Tympanic membrane and ear canal normal.      Left Ear: Tympanic membrane and ear canal normal.       Nose: Nose normal.      Mouth/Throat:      Mouth: Mucous membranes are moist.      Pharynx: No oropharyngeal exudate or posterior oropharyngeal erythema.   Eyes:      General: No scleral icterus.        Right eye: No discharge.         Left eye: No discharge.      Extraocular Movements: Extraocular movements intact.      Conjunctiva/sclera: Conjunctivae normal.      Pupils: Pupils are equal, round, and reactive to light.   Cardiovascular:      Rate and Rhythm: Normal rate and regular rhythm.      Pulses: Normal pulses.      Heart sounds: Normal heart sounds.   Pulmonary:  Effort: Pulmonary effort is normal.      Breath sounds: Normal breath sounds.   Skin:     General: Skin is warm and dry.   Neurological:      General: No focal deficit present.      Mental Status: He is alert.   Psychiatric:         Mood and Affect: Mood normal.         No scans are attached to the encounter.     Assessment:   1. Acute non-recurrent maxillary sinusitis       Plan:   No results found for any visits on 06/11/24.        Augmentin  prednisone  allergy meds fu as needed worsening sypmtoms    Belvie LOISE Pump, MD

## 2024-06-15 NOTE — Progress Notes (Signed)
 Darius Myers (DOB:  November 29, 1995) is a 28 y.o. male, here for evaluation of the following chief complaint(s):   HIV testing and getting back on truvada , gi referral , and Discuss Medications (Strattera  )    Vitals:    06/01/24 1321   BP: 126/78   Pulse: 69   Resp: 18   Temp: 97.6 F (36.4 C)   TempSrc: Oral   SpO2: 100%   Weight: 82.6 kg (182 lb 3.2 oz)   Height: 1.778 m (5' 10)      Assessment & Plan   1. High risk homosexual behavior  -     emtricitabine -tenofovir  (TRUVADA ) 200-300 MG per tablet; Take 1 tablet by mouth daily, Disp-90 tablet, R-4Normal  -     HIV Screen; Standing  -     Comprehensive Metabolic Panel; Standing  -     CBC; Standing  -     Urinalysis W/ Rflx Microscopic; Standing  2. Attention deficit hyperactivity disorder (ADHD), combined type  -     atomoxetine  (STRATTERA ) 40 MG capsule; Take 1 capsule by mouth daily, Disp-90 capsule, R-4Normal  3. Routine general medical examination at a health care facility  -     Lipid Panel; Future  4. Lynch syndrome  -     [facility], [place]  5. Need for hepatitis C screening test  -     Hepatitis C Antibody; Future    In addition to the above:  Assessment & Plan  1. ADHD:  - Issues with obtaining Strattera  due to insurance complications.  - Advised to contact the office if issues persist.  - Prescription for Strattera  will be sent to the pharmacy.    2. HIV Prophylaxis:  - Interested in resuming Truvada  due to high-risk sexual behavior.  - Informed about regular HIV screening every 3 months while on Truvada  and potential side effects.  - Urinalysis and metabolic panel will be ordered.  - Advised to contact the office if adverse effects occur.    3. Lynch Syndrome:  - Issues with billing for previous GI consultations; needs a new gastroenterologist.  - Referred to an internal gastroenterologist.    4. Health Maintenance:  - Advised to receive influenza vaccine available at their workplace.  - Cholesterol panel will be ordered as part of  wellness labs.  - Advised to complete hepatitis A series and undergo hepatitis C screening.  No follow-ups on file.     Subjective   History of Present Illness  The patient presents for a routine checkup.    They have been unable to obtain Strattera  due to pharmacy and insurance issues. They received a letter indicating the need for a follow-up consultation.    They have not taken Truvada  for several years but are considering resuming it for peace of mind due to their open relationship with their spouse. They tested negative for gonorrhea and chlamydia last week but did not test for HIV. They recall rectal issues in 02/2024 and were advised to undergo urinalysis. They and their spouse occasionally engage in sexual activities with others, typically once a year, with close friends. They had severe gonorrhea in 05/2023, treated with Augmentin , doxycycline , clindamycin, Rocephin , and azithromycin.    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 They are interested in a tetanus vaccine. Their last colonoscopy was 2 years ago. Rectal pain in 02/2024 has resolved.    They are seeking a new gastroenterologist due to billing issues with their current provider.  They are up to date on influenza vaccines.    Marital Status: Married  Occupation: EMS  Sexual Practices: Open relationship with spouse, occasional sexual activities with others         Objective   Physical Exam   Physical Exam  General Appearance:  Well appearing, developed and nourished.  Appears stated age.  No acute distress.  Head:  Normocephalic, atraumatic.  Eyes:  Pupils equal, round and reactive to light.  Extraocular muscle movements intact  Ears:  External auditory canals clear, tympanic membranes intact.  Hearing grossly intact.  Nose:  Nares patent, no lesions.  Oral Cavity:  Mucosa moist, tongue in midline.  No oropharyngeal lesions.  Neck:  Supple, full range of motion.  No cervical lymphadenopathy.  No thyroid  enlargement.  Heart:  Regular rate and regular rhythm.  No murmurs.  No rubs or gallops.  Lungs:  Clear to auscultation bilaterally with good air movement, no focal adventitial sounds.  Abdomen:  Non tender to palpation and non distended.  No masses or hernias. No organomegaly.  Skin:  Normal turgor.  No rashes noted.  Skin is warm and dry.  Musculoskeletal:  Full range of motion with no swelling or deformity.  Extremities:  No edema.  No clubbing or cyanosis.  Neurologic:  Motor strength normal in the upper and lower extremities.  Sensory exam intact.  Psych:  Cognitive function intact.  Judgment and insight normal.  Mood and affect appropriate.          Orders Only on 05/24/2024   Component Date Value Ref Range Status    Test Name 05/24/2024 CTNG BY NAA, PHARYNGEAL   Final    Reference Lab 05/24/2024 [facility]   Final    test code 05/24/2024 811301   Final    Test Results 05/24/2024 Seperate Report   Final   Orders Only on 02/25/2024   Component Date Value Ref Range Status    Chlamydia Trachomatis, NAA, Urine 02/25/2024 Negative  Negative Final    Neisseria gonorrhoeae, NAA, Urine 02/25/2024 Negative  Negative Final    Trichomonas vaginalis DNA, Urine 02/25/2024 Negative  Negative Final    Test Name 02/25/2024 [facility]   Final    Reference Lab 02/25/2024 [facility]   Final    test code 02/25/2024 180040   Final    Test Results 02/25/2024 Seperate Report   Final   Orders Only on 02/25/2024   Component Date Value Ref Range Status    FINAL REPORT 02/25/2024 No growth after 18- 24 hours.   Final   Office Visit on 12/30/2023   Component Date Value Ref Range Status    SARS-COV-2, POC 12/30/2023 Not-Detected  Not Detected Final    Lot Number 12/30/2023 0   Final    QC Pass/Fail 12/30/2023 pass   Final    Valid Internal Control, POC 12/30/2023 pass   Final    Flu A Antigen 12/30/2023 negative   Final    Flu B Antigen 12/30/2023 negative   Final    Valid Internal Control, POC 12/30/2023 valid   Final    Group A Strep  Antigen, POC 12/30/2023 Negative   Final   Office Visit on 10/21/2023   Component Date Value Ref Range Status    Valid Internal Control, POC 10/21/2023 pass   Final    Group A Strep Antigen, POC 10/21/2023 Negative   Final    FINAL REPORT 10/21/2023 Normal Respiratory Flora   Final    FINAL REPORT 10/21/2023 No  Beta Hemolytic Streptococcus Isolated   Final   Office Visit on 10/04/2023   Component Date Value Ref Range Status    Valid Internal Control, POC 10/04/2023 pass   Final    Group A Strep Antigen, POC 10/04/2023 Positive   Final   Orders Only on 08/18/2023   Component Date Value Ref Range Status    Test Name 08/18/2023 CHL/GC/PHARYNGEAL SWAB   Final    Reference Lab 08/18/2023 [facility]   Final    test code 08/18/2023 811301   Final    Test Results 08/18/2023 Seperate Report   Final   Office Visit on 08/18/2023   Component Date Value Ref Range Status    Valid Internal Control, POC 08/18/2023 PASS   Final    Flu A Antigen 08/18/2023 NEGATIVE   Final    Flu B Antigen 08/18/2023 NEGATIVE   Final   Admission on 08/16/2023, Discharged on 08/17/2023   Component Date Value Ref Range Status    Ventricular Rate 08/16/2023 79  BPM Final    P-R Interval 08/16/2023 138  ms Final    QRS Duration 08/16/2023 89  ms Final    Q-T Interval 08/16/2023 347  ms Final    QTc Calculation (Bazett) 08/16/2023 398  ms Final    P Axis 08/16/2023 40  degrees Final    R Axis 08/16/2023 84  degrees Final    T Axis 08/16/2023 32  degrees Final    Diagnosis 08/16/2023    Final                    Value:SINUS RHYTHM  NONSPECIFIC ST   BORDERLINE ECG  UNCONFIRMED REPORT    Confirmed by Nova, Sofy ([id number]) on 08/17/2023 12:54:50 PM      WBC 08/16/2023 9.7  3.8 - 10.6 x10e3/mcL Final    RBC 08/16/2023 4.70  4.00 - 5.60 x10e6/mcL Final    Hemoglobin 08/16/2023 14.7  13.0 - 17.3 g/dL Final    Hematocrit 88/89/7975 41.9  38.0 - 52.0 % Final    MCV 08/16/2023 89.1  84.0 - 100.0 fL Final    MCH 08/16/2023 31.3  27.0 - 34.5 pg Final    MCHC 08/16/2023 35.1   30.0 - 36.0 g/dL Final    RDW 88/89/7975 12.3  10.0 - 17.0 % Final    Platelets 08/16/2023 259  140 - 440 x10e3/mcL Final    MPV 08/16/2023 10.3  7.0 - 12.2 fL Final    Neutrophils % 08/16/2023 65.5  42.0 - 74.0 % Final    Lymphocytes 08/16/2023 23.1  15.0 - 45.0 % Final    Monocytes % 08/16/2023 6.2  4.0 - 12.0 % Final    Eosinophils % 08/16/2023 3.7  0.0 - 7.0 % Final    Basophils % 08/16/2023 0.4  0.0 - 2.0 % Final    Neutrophils Absolute 08/16/2023 6.3  1.6 - 7.3 x10e3/mcL Final    Lymphocytes Absolute 08/16/2023 2.2  1.0 - 3.2 x10e3/mcL Final    Monocytes Absolute 08/16/2023 0.6  0.3 - 1.0 x10e3/mcL Final    Eosinophils Absolute 08/16/2023 0.4  0.0 - 0.5 x10e3/mcL Final    Basophils Absolute 08/16/2023 0.0  0.0 - 0.2 x10e3/mcL Final    Immature Granulocytes % 08/16/2023 1.1 (H)  0.0 - 0.6 % Final    Immature Grans (Abs) 08/16/2023 0.11 (H)  0.00 - 0.06 x10e3/mcL Final    Sodium 08/16/2023 139  135 - 145 mmol/L Final    Potassium 08/16/2023 3.8  3.5 - 5.3 mmol/L Final    Chloride 08/16/2023 104  98 - 107 mmol/L Final    CO2 08/16/2023 24  22 - 29 mmol/L Final    Glucose 08/16/2023 98  70 - 99 mg/dL Final    BUN 88/89/7975 12  6 - 20 mg/dL Final    Creatinine 88/89/7975 0.8  0.7 - 1.3 mg/dL Final    Anion Gap 88/89/7975 11  2 - 17 mmol/L Final    Osmolaliy Calculated 08/16/2023 277  270 - 287 mOsm/kg Final    Calcium 08/16/2023 8.9  8.5 - 10.7 mg/dL Final    Est, Glom Filt Rate 08/16/2023 125  >=60 mL/min/1.71m Final    Color, UA 08/16/2023 Straw   Final    Appearance 08/16/2023 Clear   Final    Glucose, Ur 08/16/2023 Negative  Negative Final    Bilirubin, Urine 08/16/2023 Negative  Negative Final    Ketones, Urine 08/16/2023 Negative  Negative Final    Specific Gravity, UA 08/16/2023 1.025  1.003 - 1.035 Final    Blood, Urine 08/16/2023 Negative  Negative Final    pH, Urine 08/16/2023 6.0  4.5 - 8.0 Final    Protein, UA 08/16/2023 Negative  Negative Final    Urobilinogen, Urine 08/16/2023 0.2  >1 EU/dL Final     Nitrite, Urine 08/16/2023 Negative  Negative Final    Leukocyte Esterase, Urine 08/16/2023 Negative  Negative Final   Office Visit on 07/09/2023   Component Date Value Ref Range Status    SARS-CoV-2 07/09/2023 Not Detected  Not Detected Final    Influenza A 07/09/2023 Not Detected  Not Detected Final    Influenza B 07/09/2023 Not Detected  Not Detected Final   There may be more visits with results that are not included.      Orders Only on 05/24/2024   Component Date Value Ref Range Status    Test Name 05/24/2024 CTNG BY NAA, PHARYNGEAL   Final    Reference Lab 05/24/2024 [facility]   Final    test code 05/24/2024 811301   Final    Test Results 05/24/2024 Seperate Report   Final    Missing Attachment Reference Lab Report Can be viewed in source system      No results found for this visit on 06/01/24.   No results found for any visits on 06/01/24.  Allergies   Allergen Reactions    Ondansetron Other (See Comments)     Tongue numbness  Tongue numbness      Prior to Admission medications   Medication Sig Start Date End Date Taking? Authorizing Provider   atomoxetine  (STRATTERA ) 40 MG capsule Take 1 capsule by mouth daily 06/01/24  Yes Khaniyah Bezek D, MD   emtricitabine -tenofovir  (TRUVADA ) 200-300 MG per tablet Take 1 tablet by mouth daily 06/01/24  Yes Sonna Lipsky D, MD   cetirizine  (ZYRTEC ) 10 MG tablet Take 1 tablet by mouth daily 12/02/23  Yes [provider]   Spacer/Aero-Holding Chambers DEVI 1 Device by Does not apply route as needed (SOB, chest tightness or wheezing) 11/17/23  Yes Pippin, Lynwood ORN, APRN - NP   EPINEPHrine [facility]) 0.3 MG/0.3ML SOAJ injection  06/24/23  Yes [provider]   albuterol  sulfate HFA (VENTOLIN  HFA) 108 (90 Base) MCG/ACT inhaler Inhale 2 puffs into the lungs 4 times daily as needed for Wheezing 04/19/23  Yes Vicci Mar A, PA   pantoprazole (PROTONIX) 20 MG tablet  04/07/23  Yes [provider]   Azelastine HCl 137 MCG/SPRAY  SOLN  02/15/21  Yes [provider]   fluticasone (FLONASE) 50 MCG/ACT nasal spray USE 1 SPRAY(S) IN EACH NOSTRIL TWICE DAILY 02/15/21  Yes [provider]   amoxicillin -clavulanate (AUGMENTIN ) 875-125 MG per tablet Take 1 tablet by mouth 2 times daily for 10 days 06/11/24 06/21/24  Claudean Belvie SAILOR, MD   predniSONE  (DELTASONE ) 20 MG tablet Take 2 tablets by mouth daily for 5 days 06/11/24 06/16/24  Claudean Belvie SAILOR, MD      Family History   Problem Relation Age of Onset    Other Mother         Muir-Torre Syndrome -- genetic predisposition for CRC    Hypertension Father     No Known Problems Sister     No Known Problems Brother     No Known Problems Maternal Grandmother     No Known Problems Maternal Grandfather     No Known Problems Paternal Grandmother     No Known Problems Paternal Grandfather     No Known Problems Other       Social History     Socioeconomic History    Marital status: Single     Spouse name: Not on file    Number of children: Not on file    Years of education: Not on file    Highest education level: Not on file   Occupational History    Not on file   Tobacco Use    Smoking status: Never     Passive exposure: Never    Smokeless tobacco: Never    Tobacco comments:     Patient counselled on the dangers of tobacco 05/11/2020, Cessation: Advice given   Vaping Use    Vaping status: Never Used   Substance and Sexual Activity    Alcohol use: Yes     Comment: once ever 6-37mo    Drug use: Never    Sexual activity: Yes     Partners: Male   Other Topics Concern    Not on file   Social History Narrative    Not on file     Social Drivers of Health     Financial Resource Strain: Not on file   Food Insecurity: Not on file   Transportation Needs: Not on file   Physical Activity: Not on file   Stress: Not on file   Social Connections: Not on file   Intimate Partner Violence: Not on file   Housing Stability: Not on file      Past Surgical History:   Procedure Laterality Date    HERNIA REPAIR  2018      Past Medical History:    Diagnosis Date    ADHD     COVID-19     Environmental allergies     Severe- sees ENT    Hypokalemia     PTSD (post-traumatic stress disorder)             The patient (or guardian, if applicable) and other individuals in attendance with the patient were advised that Artificial Intelligence will be utilized during this visit to record and process the conversation to generate a clinical note. The patient (or guardian, if applicable) and other individuals in attendance at the appointment consented to the use of AI, including the recording.      An electronic signature was used to authenticate this note.  --Beverley JONETTA Munro, MD

## 2024-07-19 NOTE — Telephone Encounter (Signed)
"  The patient has confirmed their upcoming appt on 07/21/2024 @ 2:00pm. Previous GI was [facility]. Medications have been reviewed.  "

## 2024-07-21 ENCOUNTER — Ambulatory Visit
Admit: 2024-07-21 | Discharge: 2024-07-21 | Payer: BLUE CROSS/BLUE SHIELD | Attending: Gastroenterology | Primary: Family Medicine

## 2024-07-21 VITALS — BP 121/77 | HR 79 | Ht 69.0 in | Wt 179.0 lb

## 2024-07-21 DIAGNOSIS — Z1507 Genetic susceptibility to malignant neoplasm of urinary tract: Principal | ICD-10-CM

## 2024-07-21 NOTE — Progress Notes (Signed)
 "PATIENT PROFILE:  Darius Myers is a 28 y.o. male who presents to the [facility] clinic for a consultation at the request of Dr. 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     HISTORY OF PRESENT ILLNESS:  History of Present Illness  The patient presents for Lynch syndrome.    He was referred by Dr. 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 The last EGD and colonoscopy were performed in 05/2022. There are no gastrointestinal symptoms such as abdominal pain, nausea, or vomiting. For the past two years, he has experienced intermittent diarrhea, which he considers his new normal. There is no bleeding or unintentional weight loss. He undergoes annual dermatology check-ups and urinalysis every three months. He is a paramedic.     The patient has a family history of Lynch syndrome, with his mother and aunts diagnosed with Muir-Torre syndrome, a variant of Lynch syndrome. His mother has skin cancer, and his aunt has eye cancer. 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    He had an ulcer, which was treated with pantoprazole three times daily, later reduced to as needed. He used to experience sharp pains in his rib cage, attributed to the ulcer, but these have not occurred in over a year. He avoids NSAIDs like Motrin  and Tylenol.    SOCIAL HISTORY  Occupation: Paramedic    FAMILY HISTORY  - Maternal Grandfather: Colon cancer  - Mother: Muir-Torre syndrome, skin cancers  - Sister: Lynch syndrome  - Aunt: Muir-Torre syndrome, eye cancer   MSH2 Lynch    PRIOR PROCEDURES:  05/30/22 EGD/colon        GENERAL REVIEW OF SYSTEMS:   General ROS: positive for  - none  Ophthalmic ROS: negative  ENT ROS: negative  Allergy and Immunology ROS: negative  Hematological and Lymphatic ROS: negative  Endocrine ROS: negative  Respiratory ROS: negative  Cardiovascular ROS: negative  Genito-Urinary ROS: negative  Musculoskeletal ROS: negative  Neurological ROS: negative  Dermatological ROS:  negative  See HPI for pertinent positives    MEDICATIONS:  Current Outpatient Medications   Medication Sig Dispense Refill    atomoxetine  (STRATTERA ) 40 MG capsule Take 1 capsule by mouth daily 90 capsule 4    emtricitabine -tenofovir  (TRUVADA ) 200-300 MG per tablet Take 1 tablet by mouth daily 90 tablet 4    cetirizine  (ZYRTEC ) 10 MG tablet Take 1 tablet by mouth daily      Spacer/Aero-Holding Chambers DEVI 1 Device by Does not apply route as needed (SOB, chest tightness or wheezing) 1 each 0    EPINEPHrine (EPIPEN) 0.3 MG/0.3ML SOAJ injection       albuterol  sulfate HFA (VENTOLIN  HFA) 108 (90 Base) MCG/ACT inhaler Inhale 2 puffs into the lungs 4 times daily as needed for Wheezing 18 g 0    pantoprazole (PROTONIX) 20 MG tablet       Azelastine HCl 137 MCG/SPRAY SOLN       fluticasone (FLONASE) 50 MCG/ACT nasal spray USE 1 SPRAY(S) IN EACH NOSTRIL TWICE DAILY       No current facility-administered medications for this visit.        No orders of the defined types were placed in this encounter.       ALLERGIES:  Allergies   Allergen Reactions    Ondansetron Other (See Comments)     Tongue numbness  Tongue numbness        PAST MEDICAL HISTORY:  Past Medical History:   Diagnosis Date  ADHD     COVID-19     Environmental allergies     Severe- sees ENT    Hypokalemia     PTSD (post-traumatic stress disorder)         PAST SURGICAL HISTORY:  Past Surgical History:   Procedure Laterality Date    HERNIA REPAIR  2018        FAMILY HISTORY:  Family History   Problem Relation Age of Onset    Other Mother         Muir-Torre Syndrome -- genetic predisposition for CRC    Hypertension Father     No Known Problems Sister     No Known Problems Brother     No Known Problems Maternal Grandmother     No Known Problems Maternal Grandfather     No Known Problems Paternal Grandmother     No Known Problems Paternal Grandfather     No Known Problems Other         SOCIAL HISTORY:  Social History     Socioeconomic History    Marital status:  Single     Spouse name: Not on file    Number of children: Not on file    Years of education: Not on file    Highest education level: Not on file   Occupational History    Not on file   Tobacco Use    Smoking status: Never     Passive exposure: Never    Smokeless tobacco: Never    Tobacco comments:     Patient counselled on the dangers of tobacco 05/11/2020, Cessation: Advice given   Vaping Use    Vaping status: Never Used   Substance and Sexual Activity    Alcohol use: Yes     Comment: once ever 6-47mo    Drug use: Never    Sexual activity: Yes     Partners: Male   Other Topics Concern    Not on file   Social History Narrative    Not on file     Social Drivers of Health     Financial Resource Strain: Not on file   Food Insecurity: Not on file   Transportation Needs: Not on file   Physical Activity: Not on file   Stress: Not on file   Social Connections: Not on file   Intimate Partner Violence: Not on file   Housing Stability: Not on file        PHYSICAL EXAM:  Vitals:    07/21/24 1402   BP: 121/77   Pulse: 79   SpO2: 98%        81.2 kg (179 lb)      Body mass index is 26.43 kg/m.     General Appearance:    Alert, cooperative, no distress, appears stated age   Eyes:   Anciteric   Neck:  Supple, symmetrical, trachea midline, no adenopathy, no   thyroid enlargement/tenderness/nodules;    Ears/Nose/Mouth/Throat:   Lips, mucosa, and tongue normal; teeth and gums normal   Respiratory:     Clear to auscultation bilaterally, respirations unlabored    Cardiovascular:    Regular rate and rhythm, S1 and S2 normal, no       murmur,     rub   or gallop   GI:     Soft, non-tender, bowel sounds active all four quadrants,     no masses, no organomegaly   Musculoskeletal:   All 4 extremities normal, atraumatic, no cyanosis or  edema   Skin:   Skin color, texture, turgor normal, no rashes or lesions    Neurologic:   Grossly intact   REVIEW OF DATA:  I have reviewed the following data today:       Hospital Outpatient Visit on  06/02/2024   Component Date Value    HIV Screen 06/02/2024 Negative     Sodium 06/02/2024 141     Potassium 06/02/2024 4.0     Chloride 06/02/2024 102     CO2 06/02/2024 24     Glucose 06/02/2024 95     BUN 06/02/2024 15     Creatinine 06/02/2024 1.0     Anion Gap 06/02/2024 15     Osmolaliy Calculated 06/02/2024 282     Calcium 06/02/2024 9.8     Total Protein 06/02/2024 7.8     Albumin 06/02/2024 5.0     Globulin 06/02/2024 2.8     Albumin/Globulin Ratio 06/02/2024 1.79     Total Bilirubin 06/02/2024 0.73     Alk Phosphatase 06/02/2024 78     AST 06/02/2024 16     ALT 06/02/2024 24     Est, Glom Filt Rate 06/02/2024 106     WBC 06/02/2024 8.3     RBC 06/02/2024 5.11     Hemoglobin 06/02/2024 15.7     Hematocrit 06/02/2024 45.5     MCV 06/02/2024 89.0     MCH 06/02/2024 30.7     MCHC 06/02/2024 34.5     RDW 06/02/2024 11.9     Platelets 06/02/2024 275     MPV 06/02/2024 10.4     Color, UA 06/02/2024 Straw     Appearance 06/02/2024 Clear     Glucose, Ur 06/02/2024 Negative     Bilirubin, Urine 06/02/2024 Negative     Ketones, Urine 06/02/2024 Negative     Specific Gravity, UA 06/02/2024 1.010     Blood, Urine 06/02/2024 Negative     pH, Urine 06/02/2024 6.0     Protein, UA 06/02/2024 Negative     Urobilinogen, Urine 06/02/2024 0.2     Nitrite, Urine 06/02/2024 Negative     Leukocyte Esterase, Urine 06/02/2024 Negative     Cholesterol, Total 06/02/2024 149     HDL 06/02/2024 40     Triglycerides 06/02/2024 115     LDL Cholesterol 06/02/2024 86.0     LDL/HDL Ratio 06/02/2024 2.2     Chol/HDL Ratio 06/02/2024 3.7     VLDL 06/02/2024 23.0     Hepatitis C Ab 06/02/2024 Negative    Orders Only on 05/24/2024   Component Date Value    Test Name 05/24/2024 CTNG BY NAA, PHARYNGEAL     Reference Lab 05/24/2024 [facility]     test code 05/24/2024 [id number]     Test Results 05/24/2024 Seperate Report    Orders Only on 02/25/2024   Component Date Value    Chlamydia Trachomatis, N* 02/25/2024 Negative     Neisseria gonorrhoeae, N*  02/25/2024 Negative     Trichomonas vaginalis DN* 02/25/2024 Negative     Test Name 02/25/2024 ISSAC DELAY     Reference Lab 02/25/2024 [facility]     test code 02/25/2024 [id number]     Test Results 02/25/2024 Seperate Report    Orders Only on 02/25/2024   Component Date Value    FINAL REPORT 02/25/2024 No growth after 18- 24 hours.           Results         IMAGING:    ASSESSMENT AND  PLAN:  Assessment & Plan  1. Lynch Syndrome, MSH2 mutation:     - Colonoscopy every 1-2 years; schedule at earliest convenience.     - Upper endoscopy every 2-4 years; next due in 2027.     - Annual dermatology visits.     - Annual standard urinalysis.     - Enteric-coated baby aspirin 81 mg daily to reduce the risk of colon cancer.     - If polyps are detected during the colonoscopy, a follow-up procedure will be recommended in a year.     - Discussed the importance of surveillance for various cancers associated with Lynch syndrome, including colon, stomach, bladder, skin, brain, biliary tract, uterine, and ovarian cancers.     - Emphasized the need for the patient's sister to be diligent about gynecological surveillance and consider prophylactic hysterectomy and oophorectomy after childbearing age.        Prentice Loudon Posey, MD    The patient (or guardian, if applicable) and other individuals in attendance with the patient were advised that Artificial Intelligence will be utilized during this visit to record, process the conversation to generate a clinical note, and support improvement of the AI technology. The patient (or guardian, if applicable) and other individuals in attendance at the appointment consented to the use of AI, including the recording.                    "

## 2024-09-28 ENCOUNTER — Ambulatory Visit: Admit: 2024-09-28 | Discharge: 2024-09-28 | Payer: BLUE CROSS/BLUE SHIELD | Primary: Family Medicine

## 2024-09-28 VITALS — BP 117/67 | HR 98 | Temp 98.80000°F | Resp 18 | Ht 69.0 in | Wt 180.2 lb

## 2024-09-28 DIAGNOSIS — J019 Acute sinusitis, unspecified: Principal | ICD-10-CM

## 2024-09-28 MED ORDER — AMOXICILLIN-POT CLAVULANATE 875-125 MG PO TABS
875-125 | ORAL_TABLET | Freq: Two times a day (BID) | ORAL | 0 refills | 10.00000 days | Status: AC
Start: 2024-09-28 — End: 2024-10-08

## 2024-09-28 MED ORDER — PREDNISONE 20 MG PO TABS
20 | ORAL_TABLET | Freq: Two times a day (BID) | ORAL | 0 refills | 5.00000 days | Status: AC
Start: 2024-09-28 — End: 2024-10-03

## 2024-09-28 NOTE — Progress Notes (Signed)
 "Darius Myers (DOB:  1995/12/19) is a 28 y.o. male,Established patient, here for evaluation of the following chief complaint(s):  Nasal Congestion (Nasal congestion, sharp facial pain, bilateral ear pain, light headed, sx 1.5 weeks. Worse in the last 4 days. No fever)      History of Present Illness:     The patient presents in clinic with complaints of nasal ingestion, bilateral ear pain, sinus pressure-onset 1.5 weeks.  Patient states that he has began to get lightheaded over the past day.  Denies any fever, chest pain, shortness of breath.  Past Medical History:   Diagnosis Date    ADHD     COVID-19     Environmental allergies     Severe- sees ENT    Hypokalemia     PTSD (post-traumatic stress disorder)       Past Surgical History:   Procedure Laterality Date    HERNIA REPAIR  2018      Current Outpatient Medications   Medication Sig Dispense Refill    amoxicillin -clavulanate (AUGMENTIN ) 875-125 MG per tablet Take 1 tablet by mouth 2 times daily for 10 days 20 tablet 0    predniSONE  (DELTASONE ) 20 MG tablet Take 1 tablet by mouth 2 times daily for 5 days 10 tablet 0    atomoxetine  (STRATTERA ) 40 MG capsule Take 1 capsule by mouth daily 90 capsule 4    emtricitabine -tenofovir  (TRUVADA ) 200-300 MG per tablet Take 1 tablet by mouth daily 90 tablet 4    cetirizine  (ZYRTEC ) 10 MG tablet Take 1 tablet by mouth daily      Spacer/Aero-Holding Chambers DEVI 1 Device by Does not apply route as needed (SOB, chest tightness or wheezing) 1 each 0    EPINEPHrine (EPIPEN) 0.3 MG/0.3ML SOAJ injection       albuterol  sulfate HFA (VENTOLIN  HFA) 108 (90 Base) MCG/ACT inhaler Inhale 2 puffs into the lungs 4 times daily as needed for Wheezing 18 g 0    pantoprazole (PROTONIX) 20 MG tablet       Azelastine HCl 137 MCG/SPRAY SOLN       fluticasone (FLONASE) 50 MCG/ACT nasal spray USE 1 SPRAY(S) IN EACH NOSTRIL TWICE DAILY       No current facility-administered medications for this visit.      Allergies   Allergen Reactions    Ondansetron  Other (See Comments)     Tongue numbness  Tongue numbness       Vitals:    09/28/24 1351   BP: 117/67   Pulse: 98   Resp: 18   Temp: 98.8 F (37.1 C)   TempSrc: Oral   SpO2: 98%   Weight: 81.7 kg (180 lb 3.2 oz)   Height: 1.753 m (5' 9)     Review of Systems   Constitutional:  Negative for activity change and appetite change.   HENT:  Positive for sinus pressure and sinus pain. Negative for congestion, rhinorrhea and voice change.    Eyes:  Negative for redness.   Respiratory:  Negative for shortness of breath.    Cardiovascular:  Negative for chest pain.   Gastrointestinal:  Negative for abdominal pain and nausea.   Genitourinary:  Negative for dysuria and frequency.   Musculoskeletal:  Negative for joint swelling.   Skin:  Negative for color change.   Allergic/Immunologic: Negative for environmental allergies.   Neurological:  Negative for dizziness.   Psychiatric/Behavioral:  Negative for confusion.        Physical Exam:  Physical Exam  Constitutional:  Appearance: Normal appearance. He is normal weight.   HENT:      Head: Normocephalic.      Nose:      Right Sinus: Maxillary sinus tenderness present.      Mouth/Throat:      Mouth: Mucous membranes are moist.   Eyes:      Conjunctiva/sclera: Conjunctivae normal.   Cardiovascular:      Rate and Rhythm: Normal rate and regular rhythm.   Pulmonary:      Effort: Pulmonary effort is normal.      Breath sounds: Normal breath sounds.   Skin:     General: Skin is warm.   Neurological:      Mental Status: He is alert and oriented to person, place, and time. Mental status is at baseline.   Psychiatric:         Mood and Affect: Mood normal.         Behavior: Behavior normal.         Thought Content: Thought content normal.         Judgment: Judgment normal.              ASSESSMENT/PLAN:    ICD-10-CM    1. Acute sinusitis, recurrence not specified, unspecified location  J01.90            1. Acute sinusitis, recurrence not specified, unspecified location  Complete full  course of antibiotic.  Return to clinic or follow-up primary care provider if symptoms does not improve after 2 to 3 days of antibiotic use, sooner if symptoms worsen.    Visit Diagnoses         Codes      Acute sinusitis, recurrence not specified, unspecified location    -  Primary J01.90             No results found for any visits on 09/28/24.                 No follow-ups on file.      Electronically signed by:  --Nereida CHRISTELLA Candy, FNP     (Note to patient: The 21st Century Cures Act requires that medical notes like this be available to patients in the interest of transparency. However, be advised this is a medical document. It is intended as peer to peer communication. It is written in medical language and may contain abbreviations or verbiage that are unfamiliar. It may appear blunt or direct. Medical documents are intended to carry relevant information, facts as evident, and the clinical opinion of the practitioner.)    Please note that this note was generated using voice recognition Dragon dictation software.  Although every effort was made to ensure the accuracy of this automated transcription, some errors in transcription may have occurred.      "

## 2024-11-02 ENCOUNTER — Ambulatory Visit: Admit: 2024-11-02 | Discharge: 2024-11-02 | Payer: BLUE CROSS/BLUE SHIELD | Primary: Family Medicine

## 2024-11-02 VITALS — BP 120/67 | HR 91 | Temp 98.60000°F | Resp 18 | Ht 69.0 in | Wt 179.3 lb

## 2024-11-02 DIAGNOSIS — R519 Headache, unspecified: Principal | ICD-10-CM

## 2024-11-02 LAB — POCT COVID-19 RAPID, NAAT: SARS-COV-2, RdRp gene: NEGATIVE

## 2024-11-02 LAB — POCT INFLUENZA A/B DNA
Influenza virus A RNA: NEGATIVE
Influenza virus B RNA: NEGATIVE

## 2024-11-02 NOTE — Assessment & Plan Note (Signed)
{  A/P [phone]}    Orders:    POCT Influenza A/B DNA    COVID Molecular (ID NOW)

## 2024-11-02 NOTE — Progress Notes (Deleted)
 CHIEF COMPLAINT:  Chief Complaint   Patient presents with    Ear Pain    Otitis Media     He heard his heartbeat in his right ear. He has a HA and nausea. He also have some dehydration.        HISTORY OF PRESENT ILLNESS:  ***      CURRENT MEDICATION LIST:    Current Outpatient Medications   Medication Sig Dispense Refill    atomoxetine  (STRATTERA ) 40 MG capsule Take 1 capsule by mouth daily 90 capsule 4    emtricitabine -tenofovir  (TRUVADA ) 200-300 MG per tablet Take 1 tablet by mouth daily 90 tablet 4    cetirizine  (ZYRTEC ) 10 MG tablet Take 1 tablet by mouth daily      EPINEPHrine (EPIPEN) 0.3 MG/0.3ML SOAJ injection       albuterol  sulfate HFA (VENTOLIN  HFA) 108 (90 Base) MCG/ACT inhaler Inhale 2 puffs into the lungs 4 times daily as needed for Wheezing 18 g 0    pantoprazole (PROTONIX) 20 MG tablet       Azelastine HCl 137 MCG/SPRAY SOLN       fluticasone (FLONASE) 50 MCG/ACT nasal spray USE 1 SPRAY(S) IN EACH NOSTRIL TWICE DAILY      Spacer/Aero-Holding Chambers DEVI 1 Device by Does not apply route as needed (SOB, chest tightness or wheezing) 1 each 0     No current facility-administered medications for this visit.        ALLERGIES:    Allergies   Allergen Reactions    Ondansetron Other (See Comments)     Tongue numbness  Tongue numbness        HISTORY:  Past Medical History:   Diagnosis Date    ADHD     COVID-19     Environmental allergies     Severe- sees ENT    Hypokalemia     PTSD (post-traumatic stress disorder)       Past Surgical History:   Procedure Laterality Date    HERNIA REPAIR  2018      Social History     Socioeconomic History    Marital status: Single     Spouse name: Not on file    Number of children: Not on file    Years of education: Not on file    Highest education level: Not on file   Occupational History    Not on file   Tobacco Use    Smoking status: Never     Passive exposure: Never    Smokeless tobacco: Never    Tobacco comments:     Patient counselled on the  dangers of tobacco 05/11/2020, Cessation: Advice given   Vaping Use    Vaping status: Never Used   Substance and Sexual Activity    Alcohol use: Yes     Comment: once ever 6-52mo    Drug use: Never    Sexual activity: Yes     Partners: Male   Other Topics Concern    Not on file   Social History Narrative    Not on file     Social Drivers of Health     Financial Resource Strain: Not on file   Food Insecurity: Not on file   Transportation Needs: Not on file   Physical Activity: Not on file   Stress: Not on file   Social Connections: Not on file   Intimate Partner Violence: Not on file   Housing Stability: Not on file      Family History  Problem Relation Age of Onset    Other Mother         Muir-Torre Syndrome -- genetic predisposition for CRC    Hypertension Father     No Known Problems Sister     No Known Problems Brother     No Known Problems Maternal Grandmother     No Known Problems Maternal Grandfather     No Known Problems Paternal Grandmother     No Known Problems Paternal Grandfather     No Known Problems Other         REVIEW OF SYSTEMS  ***  Review of Systems     PHYSICAL EXAM:  ***  Physical Exam   Vital Signs -   Visit Vitals  BP 120/67   Pulse 91   Temp 98.6 F (37 C)   Resp 18   Ht 1.753 m (5' 9)   Wt 81.3 kg (179 lb 4.8 oz)   SpO2 96%   BMI 26.48 kg/m                LABS and XRAY  No results found for any visits on 11/02/24.           ASSESMENT AND PLAN:    Assessment & Plan           EDUCATION:   There are no Patient Instructions on file for this visit.        Follow up and Dispositions:  No follow-ups on file.       Darice Santiana Glidden, PA    I have reviewed prior visit notes and lab results pertinent to this visit. Point of care results and imaging were independently interpreted by myself and discussed with the patient. Educated the patient on disease process, expected course of illness, and management. Educated the patient on how to administer prescribed medications and possible side  effects.  Patient instructed to follow-up immediately for any new or worsening symptoms.  Patient verbalized understanding and agreement with plan of care.    Please note that this note was generated using voice recognition Scientist, clinical (histocompatibility and immunogenetics).  Although every effort was made to ensure the accuracy of this automated transcription, some errors in transcription may have occurred.

## 2024-11-02 NOTE — Progress Notes (Signed)
 CHIEF COMPLAINT:  Chief Complaint   Patient presents with    Ear Pain    Otitis Media     He heard his heartbeat in his right ear. He has a HA and nausea. He also have some dehydration.        HISTORY OF PRESENT ILLNESS:  Patient is a Dance Movement Psychotherapist.  He states today he began hearing his heartbeat in his right ear and then later had some pain that spread up and across his head like a bad headache.  The headache has improved since then but he is still having the ear discomfort.  He also had 1 episode of nausea today but it passed.  He has some GI issues and normally has some loose stools from time to time but they have been more frequent lately.  He also states he feels very thirsty and has drank 10 cups of water today but is still thirsty.  No polyuria or polyphagia.  No bodyaches chills fevers cough or congestion.  He states he does frequently get sinus infections but has not been feeling congested or having any sinus pressure recently.      CURRENT MEDICATION LIST:    Current Outpatient Medications   Medication Sig Dispense Refill    atomoxetine  (STRATTERA ) 40 MG capsule Take 1 capsule by mouth daily 90 capsule 4    emtricitabine -tenofovir  (TRUVADA ) 200-300 MG per tablet Take 1 tablet by mouth daily 90 tablet 4    cetirizine  (ZYRTEC ) 10 MG tablet Take 1 tablet by mouth daily      EPINEPHrine (EPIPEN) 0.3 MG/0.3ML SOAJ injection       albuterol  sulfate HFA (VENTOLIN  HFA) 108 (90 Base) MCG/ACT inhaler Inhale 2 puffs into the lungs 4 times daily as needed for Wheezing 18 g 0    pantoprazole (PROTONIX) 20 MG tablet       Azelastine HCl 137 MCG/SPRAY SOLN       fluticasone (FLONASE) 50 MCG/ACT nasal spray USE 1 SPRAY(S) IN EACH NOSTRIL TWICE DAILY      Spacer/Aero-Holding Chambers DEVI 1 Device by Does not apply route as needed (SOB, chest tightness or wheezing) 1 each 0     No current facility-administered medications for this visit.        ALLERGIES:    Allergies   Allergen Reactions    Ondansetron Other (See Comments)      Tongue numbness  Tongue numbness        HISTORY:  Past Medical History:   Diagnosis Date    ADHD     COVID-19     Environmental allergies     Severe- sees ENT    Hypokalemia     PTSD (post-traumatic stress disorder)       Past Surgical History:   Procedure Laterality Date    HERNIA REPAIR  2018      Social History     Socioeconomic History    Marital status: Single     Spouse name: Not on file    Number of children: Not on file    Years of education: Not on file    Highest education level: Not on file   Occupational History    Not on file   Tobacco Use    Smoking status: Never     Passive exposure: Never    Smokeless tobacco: Never    Tobacco comments:     Patient counselled on the dangers of tobacco 05/11/2020, Cessation: Advice given   Vaping Use  Vaping status: Never Used   Substance and Sexual Activity    Alcohol use: Yes     Comment: once ever 6-43mo    Drug use: Never    Sexual activity: Yes     Partners: Male   Other Topics Concern    Not on file   Social History Narrative    Not on file     Social Drivers of Health     Financial Resource Strain: Not on file   Food Insecurity: Not on file   Transportation Needs: Not on file   Physical Activity: Not on file   Stress: Not on file   Social Connections: Not on file   Intimate Partner Violence: Not on file   Housing Stability: Not on file      Family History   Problem Relation Age of Onset    Other Mother         Muir-Torre Syndrome -- genetic predisposition for CRC    Hypertension Father     No Known Problems Sister     No Known Problems Brother     No Known Problems Maternal Grandmother     No Known Problems Maternal Grandfather     No Known Problems Paternal Grandmother     No Known Problems Paternal Grandfather     No Known Problems Other         REVIEW OF SYSTEMS    Review of Systems   All other systems reviewed and are negative.       PHYSICAL EXAM:    Physical Exam  Constitutional:       Appearance: Normal appearance.   HENT:      Head: Normocephalic and  atraumatic.      Right Ear: Tympanic membrane and ear canal normal.      Left Ear: Tympanic membrane and ear canal normal.      Nose: No congestion or rhinorrhea.      Comments: Slightly boggy TURBs     Mouth/Throat:      Mouth: Mucous membranes are moist.      Pharynx: No oropharyngeal exudate or posterior oropharyngeal erythema.   Eyes:      Conjunctiva/sclera: Conjunctivae normal.   Cardiovascular:      Rate and Rhythm: Normal rate and regular rhythm.      Pulses: Normal pulses.      Heart sounds: Normal heart sounds.   Pulmonary:      Effort: Pulmonary effort is normal. No respiratory distress.      Breath sounds: Normal breath sounds. No wheezing, rhonchi or rales.   Abdominal:      General: Abdomen is flat. Bowel sounds are normal. There is no distension.      Palpations: Abdomen is soft.      Tenderness: There is no abdominal tenderness. There is no guarding.   Musculoskeletal:      Cervical back: Neck supple.   Lymphadenopathy:      Cervical: No cervical adenopathy.   Skin:     General: Skin is warm and dry.   Neurological:      Mental Status: He is alert.        Vital Signs -   Visit Vitals  BP 120/67   Pulse 91   Temp 98.6 F (37 C)   Resp 18   Ht 1.753 m (5' 9)   Wt 81.3 kg (179 lb 4.8 oz)   SpO2 96%   BMI 26.48 kg/m  LABS and XRAY  No results found for any visits on 11/02/24.           ASSESMENT AND PLAN:    Assessment & Plan  Acute nonintractable headache, unspecified headache type       Orders:    POCT Influenza A/B DNA    COVID Molecular (ID NOW)    Loose stools       Orders:    POCT Influenza A/B DNA    COVID Molecular (ID NOW)    Right ear pain            Nausea       Orders:    POCT Influenza A/B DNA    COVID Molecular (ID NOW)       No obvious explanation of symptoms.  We did discuss that as his symptoms just started it could be something that is just coming on that has not fully manifested yet.  Tests not may not be reliable at this stage.  If any new or worsening symptoms or if  the ear pain does not improve please return to clinic for reevaluation.  Patient voiced understanding.    EDUCATION:   There are no Patient Instructions on file for this visit.        Follow up and Dispositions:  No follow-ups on file.       Darice Ura Hausen, PA    I have reviewed prior visit notes and lab results pertinent to this visit. Point of care results and imaging were independently interpreted by myself and discussed with the patient. Educated the patient on disease process, expected course of illness, and management. Educated the patient on how to administer prescribed medications and possible side effects.  Patient instructed to follow-up immediately for any new or worsening symptoms.  Patient verbalized understanding and agreement with plan of care.    Please note that this note was generated using voice recognition Scientist, clinical (histocompatibility and immunogenetics).  Although every effort was made to ensure the accuracy of this automated transcription, some errors in transcription may have occurred.
